# Patient Record
Sex: Male | Born: 2000 | Race: White | Hispanic: No | Marital: Single | Smoking: Current some day smoker
Health system: Southern US, Community
[De-identification: ages and names within clinical notes are randomized; demographics above are authoritative.]

## PROBLEM LIST (undated history)

## (undated) DIAGNOSIS — J302 Other seasonal allergic rhinitis: Secondary | ICD-10-CM

## (undated) DIAGNOSIS — J45909 Unspecified asthma, uncomplicated: Secondary | ICD-10-CM

## (undated) HISTORY — PX: FRACTURE SURGERY: SHX138

---
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
## 2018-05-01 ENCOUNTER — Other Ambulatory Visit: Payer: Self-pay

## 2018-05-01 ENCOUNTER — Encounter: Payer: Self-pay | Admitting: Emergency Medicine

## 2018-05-01 ENCOUNTER — Ambulatory Visit
Admission: EM | Admit: 2018-05-01 | Discharge: 2018-05-01 | Disposition: A | Discharge status: Home / Self Care | Payer: BLUE CROSS/BLUE SHIELD | Attending: Family Medicine | Admitting: Family Medicine

## 2018-05-01 DIAGNOSIS — J0110 Acute frontal sinusitis, unspecified: Secondary | ICD-10-CM

## 2018-05-01 DIAGNOSIS — J069 Acute upper respiratory infection, unspecified: Secondary | ICD-10-CM | POA: Diagnosis not present

## 2018-05-01 DIAGNOSIS — J0100 Acute maxillary sinusitis, unspecified: Secondary | ICD-10-CM

## 2018-05-01 MED ORDER — DOXYCYCLINE HYCLATE 100 MG PO CAPS: 100.0000 mg | ORAL_CAPSULE | Freq: Two times a day (BID) | ORAL | 0 refills | Status: DC

## 2018-05-01 NOTE — ED Provider Notes (Signed)
[facility] ____________________________________________  Time seen: Approximately 2:10 PM  I have reviewed the triage vital signs and the nursing notes.   HISTORY  Chief Complaint Facial Pain and Cough   HPI Ryan Mcintosh is a 17 y.o. male presenting for evaluation of runny nose, nasal congestion, cough and chest congestion sensation present for the last 3 weeks.  States initially felt like it was a cold but has not yet gone away.  States he has been having continued sinus pressure in his forehead and his cheeks described as mild to moderate.  States getting a lot of thick greenish-yellowish nasal drainage out.  States cough is sometimes productive but also sometimes dry and hacky.  States cough is not been bothering him as much.  States more sinus pressure is bothering him more.  Unresolved with multiple over-the-counter cough and cold combination agents.  Denies fevers.  Continues to overall eat and drink well.  Some intermittent sore throat, mild sore throat currently.  Does intermittently have some pain in his chest when coughing or deep breathing, denies any chest pain at all currently.  No shortness of breath.  Denies hemoptysis.  Denies long travel or immobilization.  No lower extremity swelling.  Former smoker, not currently.  No vaping.  Denies other aggravating or alleviating factors.  Reports otherwise feels well. Denies recent sickness.    Past Medical History:  Diagnosis Date  . Asthma   . Seasonal allergies     There are no active problems to display for this patient.   History reviewed. No pertinent surgical history.   No current facility-administered medications for this encounter.   Current Outpatient Medications:  .  doxycycline (VIBRAMYCIN) 100 MG capsule, Take 1 capsule (100 mg total) by mouth 2 (two) times daily., Disp: 20 capsule, Rfl: 0  Allergies Patient has no known allergies.  Family History  Problem Relation Age of Onset  . Healthy  Mother   . Healthy Father     Social History Social History   Tobacco Use  . Smoking status: Never Smoker  . Smokeless tobacco: Former Engineer, water Use Topics  . Alcohol use: Yes  . Drug use: Yes    Types: Marijuana    Review of Systems Constitutional: No fever/chills ENT: No sore throat. Cardiovascular: As above.  Respiratory: Denies shortness of breath. Gastrointestinal: No abdominal pain.   Musculoskeletal: Negative for back pain. Skin: Negative for rash.  ____________________________________________   PHYSICAL EXAM:  VITAL SIGNS: ED Triage Vitals  Enc Vitals Group     BP 05/01/18 1352 133/79     Pulse Rate 05/01/18 1352 (!) 55     Resp 05/01/18 1352 18     Temp 05/01/18 1352 98.2 F (36.8 C)     Temp Source 05/01/18 1352 Oral     SpO2 05/01/18 1352 100 %     Weight 05/01/18 1353 140 lb (63.5 kg)     Height 05/01/18 1353 5\' 8"  (1.727 m)     Head Circumference --      Peak Flow --      Pain Score 05/01/18 1353 6     Pain Loc --      Pain Edu? --      Excl. in GC? --    Constitutional: Alert and oriented. Well appearing and in no acute distress. Eyes: Conjunctivae are normal.  Head: Atraumatic.Mild to moderate tenderness to palpation bilateral frontal and maxillary sinuses. No swelling. No erythema.   Ears: no erythema, normal TMs  bilaterally.   Nose: nasal congestion with bilateral nasal turbinate erythema and edema.   Mouth/Throat: Mucous membranes are moist. Oropharynx non-erythematous.No tonsillar swelling or exudate.  Neck: No stridor.  No cervical spine tenderness to palpation. Hematological/Lymphatic/Immunilogical: No cervical lymphadenopathy. Cardiovascular: Normal rate, regular rhythm. Grossly normal heart sounds.  Good peripheral circulation. Respiratory: Normal respiratory effort. No retractions. No wheezes or rales. Mild scattered rhonchi. Good air movement. Speaks in complete sentences. Musculoskeletal: Steady gait.  No lower extremity edema  noted bilaterally. Neurologic:  Normal speech and language. No gait instability. Skin:  Skin is warm, dry and intact. No rash noted.  Psychiatric: Mood and affect are normal. Speech and behavior are normal.   ___________________________________________   LABS (all labs ordered are listed, but only abnormal results are displayed)  Labs Reviewed - No data to display ____________________________________________ PROCEDURES Procedures    INITIAL IMPRESSION / ASSESSMENT AND PLAN / ED COURSE  Pertinent labs & imaging results that were available during my care of the patient were reviewed by me and considered in my medical decision making (see chart for details).  Well-appearing patient.  No acute distress.  Suspect recent viral upper respiratory infection, suspect secondary frontal and maxillary sinusitis.  Will treat with oral doxycycline, continue over-the-counter cough and congestion medication, encourage supportive care.  Rest and fluids.  Discussed very strict follow-up and return parameters.  Patient agreed with this plan. Discussed indication, risks and benefits of medications with patient, including photosensitivity.  Discussed follow up with Primary care physician this week as needed. Discussed follow up and return parameters including no resolution or any worsening concerns. Patient verbalized understanding and agreed to plan.   ____________________________________________   FINAL CLINICAL IMPRESSION(S) / ED DIAGNOSES  Final diagnoses:  Acute frontal sinusitis, recurrence not specified  Acute maxillary sinusitis, recurrence not specified  Upper respiratory tract infection, unspecified type     ED Discharge Orders         Ordered    doxycycline (VIBRAMYCIN) 100 MG capsule  2 times daily     05/01/18 1411           Note: This dictation was prepared with Dragon dictation along with smaller phrase technology. Any transcriptional errors that result from this process are  unintentional.         Renford Dills, NP 05/01/18 1441

## 2018-05-01 NOTE — ED Triage Notes (Signed)
Patient c/o sinus pain/pressure, chest congestion x 3 weeks. Patient has tried OTC cold and sinus flu medicine with no relief.

## 2018-05-01 NOTE — Discharge Instructions (Addendum)
Take medication as prescribed. Rest. Drink plenty of fluids. Over the counter medication as discussed.  ° °Follow up with your primary care physician this week as needed. Return to Urgent care for new or worsening concerns.  ° °

## 2018-05-07 ENCOUNTER — Ambulatory Visit
Admission: EM | Admit: 2018-05-07 | Discharge: 2018-05-07 | Disposition: A | Discharge status: Home / Self Care | Payer: BLUE CROSS/BLUE SHIELD | Attending: Family Medicine | Admitting: Family Medicine

## 2018-05-07 ENCOUNTER — Encounter: Payer: Self-pay | Admitting: Emergency Medicine

## 2018-05-07 ENCOUNTER — Other Ambulatory Visit: Payer: Self-pay

## 2018-05-07 DIAGNOSIS — K219 Gastro-esophageal reflux disease without esophagitis: Secondary | ICD-10-CM | POA: Diagnosis not present

## 2018-05-07 DIAGNOSIS — J988 Other specified respiratory disorders: Secondary | ICD-10-CM

## 2018-05-07 MED ORDER — ALBUTEROL SULFATE HFA 108 (90 BASE) MCG/ACT IN AERS: 1.0000 | INHALATION_SPRAY | Freq: Four times a day (QID) | RESPIRATORY_TRACT | 0 refills | Status: DC | PRN

## 2018-05-07 MED ORDER — PANTOPRAZOLE SODIUM 40 MG PO TBEC: 40.0000 mg | DELAYED_RELEASE_TABLET | Freq: Every day | ORAL | 2 refills | Status: AC

## 2018-05-07 MED ORDER — PREDNISONE 50 MG PO TABS: ORAL_TABLET | ORAL | 0 refills | Status: DC

## 2018-05-07 MED ORDER — AMOXICILLIN-POT CLAVULANATE 875-125 MG PO TABS: 1.0000 | ORAL_TABLET | Freq: Two times a day (BID) | ORAL | 0 refills | Status: DC

## 2018-05-07 NOTE — ED Provider Notes (Signed)
[facility]    CSN: [id number] Arrival date & time: 05/07/18  1816  History   Chief Complaint Chief Complaint  Patient presents with  . Sinus Problem  . Cough    HPI  17 year old male presents with persistent respiratory symptoms.  Patient was recently seen last week and was diagnosed with sinusitis.  He states that he has continued to have symptoms despite the antibody.  He reports chest congestion as well as sinus congestion.  He also reports shortness of breath and nonproductive cough.  He has had no improvement in his symptoms.  Has a history of asthma.  Feels short of breath quite often.  Additionally, patient reports associated abdominal bloating and burning/heartburn.  He states it is worse after he eats because he often lies down quickly after eating.  He is taking Zantac without resolution.  No fever.  No other reported symptoms.  No other complaints.  Hx reviewed as below. Past Medical History:  Diagnosis Date  . Asthma   . Seasonal allergies    History reviewed. No pertinent surgical history.  Home Medications    Prior to Admission medications   Medication Sig Start Date End Date Taking? Authorizing Provider  albuterol (PROVENTIL HFA;VENTOLIN HFA) 108 (90 Base) MCG/ACT inhaler Inhale 1-2 puffs into the lungs every 6 (six) hours as needed for wheezing or shortness of breath. 05/07/18   Tommie Sams, DO  amoxicillin-clavulanate (AUGMENTIN) 875-125 MG tablet Take 1 tablet by mouth every 12 (twelve) hours. 05/07/18   Tommie Sams, DO  pantoprazole (PROTONIX) 40 MG tablet Take 1 tablet (40 mg total) by mouth daily. 05/07/18   Tommie Sams, DO  predniSONE (DELTASONE) 50 MG tablet 1 tablet daily x 5 days 05/07/18   Tommie Sams, DO   Family History Family History  Problem Relation Age of Onset  . Healthy Mother   . Healthy Father    Social History Social History   Tobacco Use  . Smoking status: Never Smoker  . Smokeless tobacco: Former Research scientist (medical) Use Topics  . Alcohol use: Yes  . Drug use: Yes    Types: Marijuana   Allergies   Patient has no known allergies.  Review of Systems Review of Systems  Constitutional: Negative for fever.  HENT: Positive for congestion.   Respiratory: Positive for cough and shortness of breath.   Gastrointestinal:       GERD.   Physical Exam Triage Vital Signs ED Triage Vitals  Enc Vitals Group     BP 05/07/18 1828 135/83     Pulse Rate 05/07/18 1828 69     Resp 05/07/18 1828 16     Temp 05/07/18 1828 97.6 F (36.4 C)     Temp Source 05/07/18 1828 Oral     SpO2 05/07/18 1828 100 %     Weight 05/07/18 1825 140 lb (63.5 kg)     Height 05/07/18 1825 5\' 8"  (1.727 m)     Head Circumference --      Peak Flow --      Pain Score 05/07/18 1825 6     Pain Loc --      Pain Edu? --      Excl. in GC? --    Updated Vital Signs BP 135/83 (BP Location: Left Arm)   Pulse 69   Temp 97.6 F (36.4 C) (Oral)   Resp 16   Ht 5\' 8"  (1.727 m)   Wt 63.5 kg   SpO2 100%  BMI 21.29 kg/m   Physical Exam  Constitutional: He is oriented to person, place, and time. He appears well-developed. No distress.  HENT:  Head: Normocephalic and atraumatic.  Nose: Nose normal.  Mouth/Throat: Oropharynx is clear and moist.  Eyes: Conjunctivae are normal. Right eye exhibits no discharge. Left eye exhibits no discharge.  Cardiovascular: Normal rate and regular rhythm.  Pulmonary/Chest: Effort normal and breath sounds normal. He has no wheezes. He has no rales.  Neurological: He is alert and oriented to person, place, and time.  Psychiatric:  Anxious.   Nursing note and vitals reviewed.  UC Treatments / Results  Labs (all labs ordered are listed, but only abnormal results are displayed) Labs Reviewed - No data to display  EKG None  Radiology No results found.  Procedures Procedures (including critical care time)  Medications Ordered in UC Medications - No data to display  Initial  Impression / Assessment and Plan / UC Course  I have reviewed the triage vital signs and the nursing notes.  Pertinent labs & imaging results that were available during my care of the patient were reviewed by me and considered in my medical decision making (see chart for details).    17 year old male presents with persistent respiratory symptoms.  He has not improved since he was placed on antibiotic therapy.  Stopping doxycycline.  Started Augmentin.  Placing on prednisone as well.  Albuterol as needed.  Additionally, patient experiencing symptoms of GERD.  Starting on Protonix.  Final Clinical Impressions(s) / UC Diagnoses   Final diagnoses:  Respiratory infection  Gastroesophageal reflux disease without esophagitis     Discharge Instructions     Medications as prescribed.  Hope you feel better  Take care  Dr. Adriana Simas     ED Prescriptions    Medication Sig Dispense Auth. Provider   amoxicillin-clavulanate (AUGMENTIN) 875-125 MG tablet Take 1 tablet by mouth every 12 (twelve) hours. 14 tablet Darrie Macmillan G, DO   predniSONE (DELTASONE) 50 MG tablet 1 tablet daily x 5 days 5 tablet Leiyah Maultsby G, DO   pantoprazole (PROTONIX) 40 MG tablet Take 1 tablet (40 mg total) by mouth daily. 30 tablet Melena Hayes G, DO   albuterol (PROVENTIL HFA;VENTOLIN HFA) 108 (90 Base) MCG/ACT inhaler Inhale 1-2 puffs into the lungs every 6 (six) hours as needed for wheezing or shortness of breath. 1 Inhaler Tommie Sams, DO     Controlled Substance Prescriptions [place] Controlled Substance Registry consulted? Not Applicable   Tommie Sams, DO 05/07/18 1902

## 2018-05-07 NOTE — ED Triage Notes (Signed)
Patient c/o ongoing cough, chest congestion and sinus congestion and pressure for over a week.  Patient was seen a week ago and has been on an antibiotic.  Patient has one more dose of his antibiotic left.  Patient not sure if he has GERD also.

## 2018-05-07 NOTE — Discharge Instructions (Signed)
Medications as prescribed.  Hope you feel better.  Take care  Dr. Tess Potts  

## 2018-05-18 ENCOUNTER — Other Ambulatory Visit: Payer: Self-pay

## 2018-05-18 ENCOUNTER — Emergency Department
Admission: EM | Admit: 2018-05-18 | Discharge: 2018-05-18 | Disposition: A | Discharge status: Home / Self Care | Payer: BLUE CROSS/BLUE SHIELD | Attending: Emergency Medicine | Admitting: Emergency Medicine

## 2018-05-18 ENCOUNTER — Emergency Department: Payer: BLUE CROSS/BLUE SHIELD

## 2018-05-18 DIAGNOSIS — N50819 Testicular pain, unspecified: Secondary | ICD-10-CM | POA: Insufficient documentation

## 2018-05-18 DIAGNOSIS — Z5321 Procedure and treatment not carried out due to patient leaving prior to being seen by health care provider: Secondary | ICD-10-CM | POA: Insufficient documentation

## 2018-05-18 LAB — URINALYSIS, COMPLETE (UACMP) WITH MICROSCOPIC
BILIRUBIN URINE: NEGATIVE
Bacteria, UA: NONE SEEN
Glucose, UA: NEGATIVE mg/dL
HGB URINE DIPSTICK: NEGATIVE
Ketones, ur: NEGATIVE mg/dL
Leukocytes, UA: NEGATIVE
Nitrite: NEGATIVE
PH: 8 (ref 5.0–8.0)
Protein, ur: NEGATIVE mg/dL
SPECIFIC GRAVITY, URINE: 1.005 (ref 1.005–1.030)
Squamous Epithelial / LPF: NONE SEEN (ref 0–5)
WBC, UA: NONE SEEN WBC/hpf (ref 0–5)

## 2018-05-18 NOTE — ED Notes (Signed)
Called for room. No reponse.

## 2018-05-18 NOTE — ED Notes (Signed)
Called for room. No response 

## 2018-05-18 NOTE — ED Triage Notes (Signed)
Sent by urgent care for possible testicular torsion. Pain since yesterday. Denies injury.

## 2018-05-18 NOTE — ED Notes (Signed)
Patient not answering when called.  This nurse called patient on mobile phone.  He stated he left due to long wait.  He stated he would return if he felt worse.

## 2018-06-10 ENCOUNTER — Other Ambulatory Visit: Payer: Self-pay

## 2018-06-10 ENCOUNTER — Ambulatory Visit
Admission: EM | Admit: 2018-06-10 | Discharge: 2018-06-10 | Disposition: A | Discharge status: Home / Self Care | Payer: BLUE CROSS/BLUE SHIELD | Attending: Family Medicine | Admitting: Family Medicine

## 2018-06-10 ENCOUNTER — Encounter: Payer: Self-pay | Admitting: Emergency Medicine

## 2018-06-10 DIAGNOSIS — J111 Influenza due to unidentified influenza virus with other respiratory manifestations: Secondary | ICD-10-CM | POA: Diagnosis not present

## 2018-06-10 DIAGNOSIS — R059 Cough, unspecified: Secondary | ICD-10-CM

## 2018-06-10 DIAGNOSIS — J029 Acute pharyngitis, unspecified: Secondary | ICD-10-CM | POA: Diagnosis not present

## 2018-06-10 DIAGNOSIS — R05 Cough: Secondary | ICD-10-CM | POA: Diagnosis not present

## 2018-06-10 DIAGNOSIS — J3089 Other allergic rhinitis: Secondary | ICD-10-CM | POA: Diagnosis not present

## 2018-06-10 LAB — RAPID INFLUENZA A&B ANTIGENS (ARMC ONLY)
INFLUENZA A (ARMC): NEGATIVE
INFLUENZA B (ARMC): NEGATIVE

## 2018-06-10 LAB — RAPID STREP SCREEN (MED CTR MEBANE ONLY): STREPTOCOCCUS, GROUP A SCREEN (DIRECT): NEGATIVE

## 2018-06-10 MED ORDER — ALBUTEROL SULFATE HFA 108 (90 BASE) MCG/ACT IN AERS: 2.0000 | INHALATION_SPRAY | Freq: Four times a day (QID) | RESPIRATORY_TRACT | 1 refills | Status: DC | PRN

## 2018-06-10 MED ORDER — OSELTAMIVIR PHOSPHATE 75 MG PO CAPS: 75.0000 mg | ORAL_CAPSULE | Freq: Two times a day (BID) | ORAL | 0 refills | Status: AC

## 2018-06-10 MED ORDER — MONTELUKAST SODIUM 10 MG PO TABS: 10.0000 mg | ORAL_TABLET | Freq: Every day | ORAL | 2 refills | Status: DC

## 2018-06-10 MED ORDER — CETIRIZINE HCL 10 MG PO TABS: 10.0000 mg | ORAL_TABLET | Freq: Every day | ORAL | 2 refills | Status: DC

## 2018-06-10 NOTE — ED Triage Notes (Signed)
Pt c/o sore throat, fever (102 last night), post nasal drainage, swollen tonsils, dry cough, shortness of breath, body aches and chills. Started about 2 days ago. He was treated for a URI back in October and has had post nasal drainage since then.

## 2018-06-10 NOTE — ED Provider Notes (Signed)
[facility]    CSN: [id number] Arrival date & time: 06/10/18  0945     History   Chief Complaint Chief Complaint  Patient presents with  . Sore Throat    HPI Ryan Mcintosh is a 17 y.o. male.   17 year old male presents with sore throat, nasal congestion, cough, body aches and chills that started 2 days ago. Yesterday developed a fever of 102. Denies any GI symptoms. No distinct family or friends who are ill. Has taken Mucus relief OTC medication and Ibuprofen with minimal relief. Has history of asthma and allergic rhinitis. Ran out of Albuterol recently and was on Zyrtec and Singulair in the past with good success. Has had persistent nasal drainage since Urgent Care visit in October. Was on Doxycycline and then switched to Augmentin and Prednisone for sinus infection/drainage with limited success. Also takes Protonix daily prn for reflux. Did not get a flu vaccine this year.   The history is provided by the patient.    Past Medical History:  Diagnosis Date  . Asthma   . Seasonal allergies     There are no active problems to display for this patient.   Past Surgical History:  Procedure Laterality Date  . FRACTURE SURGERY         Home Medications    Prior to Admission medications   Medication Sig Start Date End Date Taking? Authorizing Provider  pantoprazole (PROTONIX) 40 MG tablet Take 1 tablet (40 mg total) by mouth daily. 05/07/18  Yes Mcintosh, Ryan G, DO  albuterol (PROVENTIL HFA;VENTOLIN HFA) 108 (90 Base) MCG/ACT inhaler Inhale 2 puffs into the lungs every 6 (six) hours as needed for wheezing or shortness of breath. 06/10/18   Sudie Grumbling, NP  cetirizine (ZYRTEC) 10 MG tablet Take 1 tablet (10 mg total) by mouth daily. 06/10/18   Sudie Grumbling, NP  montelukast (SINGULAIR) 10 MG tablet Take 1 tablet (10 mg total) by mouth at bedtime. 06/10/18   Sudie Grumbling, NP  oseltamivir (TAMIFLU) 75 MG capsule Take 1 capsule (75 mg total) by mouth  every 12 (twelve) hours for 5 days. 06/10/18 06/15/18  Sudie Grumbling, NP    Family History Family History  Problem Relation Age of Onset  . Healthy Mother   . Healthy Father     Social History Social History   Tobacco Use  . Smoking status: Never Smoker  . Smokeless tobacco: Current User    Types: Snuff  Substance Use Topics  . Alcohol use: Not Currently  . Drug use: Not Currently    Types: Marijuana     Allergies   Patient has no known allergies.   Review of Systems Review of Systems  Constitutional: Positive for chills, fatigue and fever. Negative for activity change and appetite change.  HENT: Positive for congestion, postnasal drip, rhinorrhea, sinus pressure and sore throat. Negative for ear discharge, ear pain, facial swelling, mouth sores, nosebleeds, sinus pain, sneezing and trouble swallowing.   Eyes: Negative for pain, discharge, redness and itching.  Respiratory: Positive for cough. Negative for chest tightness, shortness of breath and wheezing.   Gastrointestinal: Negative for abdominal pain, diarrhea, nausea and vomiting.  Musculoskeletal: Positive for arthralgias and myalgias. Negative for neck pain and neck stiffness.  Skin: Negative for color change, rash and wound.  Allergic/Immunologic: Positive for environmental allergies.  Neurological: Positive for headaches. Negative for dizziness, tremors, seizures, syncope, speech difficulty, weakness, light-headedness and numbness.  Hematological: Negative for adenopathy. Does not  bruise/bleed easily.     Physical Exam Triage Vital Signs ED Triage Vitals  Enc Vitals Group     BP 06/10/18 1000 135/84     Pulse Rate 06/10/18 1000 80     Resp 06/10/18 1000 17     Temp 06/10/18 1000 98.3 F (36.8 C)     Temp Source 06/10/18 1000 Oral     SpO2 06/10/18 1000 99 %     Weight 06/10/18 0955 143 lb (64.9 kg)     Height 06/10/18 0955 5\' 7"  (1.702 m)     Head Circumference --      Peak Flow --      Pain Score  06/10/18 0955 7     Pain Loc --      Pain Edu? --      Excl. in GC? --    No data found.  Updated Vital Signs BP 135/84 (BP Location: Left Arm)   Pulse 80   Temp 98.3 F (36.8 C) (Oral)   Resp 17   Ht 5\' 7"  (1.702 m)   Wt 143 lb (64.9 kg)   SpO2 99%   BMI 22.40 kg/m   Visual Acuity Right Eye Distance:   Left Eye Distance:   Bilateral Distance:    Right Eye Near:   Left Eye Near:    Bilateral Near:     Physical Exam  Constitutional: He is oriented to person, place, and time. Vital signs are normal. He appears well-developed and well-nourished. He is cooperative. He appears ill. No distress.  He is sitting comfortably on exam table in no acute distress but appears ill.   HENT:  Head: Normocephalic and atraumatic.  Right Ear: Hearing, tympanic membrane, external ear and ear canal normal.  Left Ear: Hearing, tympanic membrane, external ear and ear canal normal.  Nose: Mucosal edema and rhinorrhea present. Right sinus exhibits maxillary sinus tenderness. Right sinus exhibits no frontal sinus tenderness. Left sinus exhibits maxillary sinus tenderness. Left sinus exhibits no frontal sinus tenderness.  Mouth/Throat: Uvula is midline and mucous membranes are normal. Posterior oropharyngeal erythema present.  Eyes: EOM are normal. Right conjunctiva is injected (mildly). Left conjunctiva is injected (mildly).  Neck: Normal range of motion. Neck supple.  Cardiovascular: Normal rate, regular rhythm and normal heart sounds.  No murmur heard. Pulmonary/Chest: Effort normal and breath sounds normal. No respiratory distress. He has no decreased breath sounds. He has no wheezes. He has no rhonchi. He has no rales.  Musculoskeletal: Normal range of motion.  Lymphadenopathy:    He has no cervical adenopathy.  Neurological: He is alert and oriented to person, place, and time.  Skin: Skin is warm and dry. Capillary refill takes less than 2 seconds. No rash noted.  Psychiatric: He has a  normal mood and affect. His behavior is normal. Judgment and thought content normal.  Vitals reviewed.    UC Treatments / Results  Labs (all labs ordered are listed, but only abnormal results are displayed) Labs Reviewed  RAPID INFLUENZA A&B ANTIGENS ([facility] ONLY)  RAPID STREP SCREEN ([facility] ONLY)  CULTURE, GROUP A STREP [facility])    EKG None  Radiology No results found.  Procedures Procedures (including critical care time)  Medications Ordered in UC Medications - No data to display  Initial Impression / Assessment and Plan / UC Course  I have reviewed the triage vital signs and the nursing notes.  Pertinent labs & imaging results that were available during my care of the patient were reviewed  by me and considered in my medical decision making (see chart for details).    Reviewed negative rapid strep test with patient. Also discussed rapid influenza testing and limited accuracy. Discussed that he probably has influenza based on history and clinical findings. Patient desires anti-viral medications. Recommend start Tamiflu 75mg  twice a day as directed. May take OTC Sudafed and Mucinex as directed for congestion. Recommend restart allergy medications- Zyrtec 10mg  daily, Singulair 10mg  daily and use Albuterol inhaler 2 puffs every 6 hours as needed for cough or wheezing. May use Ibuprofen as directed for fever. May need ENT consult if allergic rhinitis symptoms do not improve. Follow-up with [facility] in 5 to 7 days if needed.  Final Clinical Impressions(s) / UC Diagnoses   Final diagnoses:  Influenza  Sore throat  Cough  Non-seasonal allergic rhinitis due to other allergic trigger     Discharge Instructions     Recommend start Tamiflu 75mg  twice a day as directed. May use Singulair 10mg  once daily. Continue Albuterol inhaler 2 puffs every 6 hours as needed for wheezing and cough. May restart Zyrtec 10mg  once daily. Encouraged to continue OTC Sudafed and Mucinex to help  with congestion. May continue Ibuprofen as needed for fever. Follow-up with [facility] if nasal drainage and congestion do not start to improve within 5 to 7 days.     ED Prescriptions    Medication Sig Dispense Auth. Provider   albuterol (PROVENTIL HFA;VENTOLIN HFA) 108 (90 Base) MCG/ACT inhaler Inhale 2 puffs into the lungs every 6 (six) hours as needed for wheezing or shortness of breath. 1 Inhaler Amyot, Ali Lowe, NP   oseltamivir (TAMIFLU) 75 MG capsule Take 1 capsule (75 mg total) by mouth every 12 (twelve) hours for 5 days. 10 capsule Sudie Grumbling, NP   montelukast (SINGULAIR) 10 MG tablet Take 1 tablet (10 mg total) by mouth at bedtime. 30 tablet Sudie Grumbling, NP   cetirizine (ZYRTEC) 10 MG tablet Take 1 tablet (10 mg total) by mouth daily. 30 tablet Sudie Grumbling, NP     Controlled Substance Prescriptions [place] Controlled Substance Registry consulted? Not Applicable   Sudie Grumbling, NP 06/11/18 [id number]

## 2018-06-10 NOTE — Discharge Instructions (Addendum)
Recommend start Tamiflu 75mg  twice a day as directed. May use Singulair 10mg  once daily. Continue Albuterol inhaler 2 puffs every 6 hours as needed for wheezing and cough. May restart Zyrtec 10mg  once daily. Encouraged to continue OTC Sudafed and Mucinex to help with congestion. May continue Ibuprofen as needed for fever. Follow-up with [facility] if nasal drainage and congestion do not start to improve within 5 to 7 days.

## 2018-06-13 LAB — CULTURE, GROUP A STREP (THRC)

## 2018-10-27 DIAGNOSIS — J4540 Moderate persistent asthma, uncomplicated: Secondary | ICD-10-CM | POA: Insufficient documentation

## 2018-11-15 ENCOUNTER — Emergency Department: Payer: BLUE CROSS/BLUE SHIELD

## 2018-11-15 ENCOUNTER — Emergency Department
Admission: EM | Admit: 2018-11-15 | Discharge: 2018-11-15 | Disposition: A | Discharge status: Home / Self Care | Payer: BLUE CROSS/BLUE SHIELD | Attending: Emergency Medicine | Admitting: Emergency Medicine

## 2018-11-15 ENCOUNTER — Ambulatory Visit (INDEPENDENT_AMBULATORY_CARE_PROVIDER_SITE_OTHER)
Admission: EM | Admit: 2018-11-15 | Discharge: 2018-11-15 | Disposition: A | Discharge status: Home / Self Care | Payer: BLUE CROSS/BLUE SHIELD | Source: Home / Self Care | Attending: Family Medicine | Admitting: Family Medicine

## 2018-11-15 ENCOUNTER — Encounter: Payer: Self-pay | Admitting: Emergency Medicine

## 2018-11-15 ENCOUNTER — Other Ambulatory Visit: Payer: Self-pay

## 2018-11-15 DIAGNOSIS — K2920 Alcoholic gastritis without bleeding: Secondary | ICD-10-CM | POA: Insufficient documentation

## 2018-11-15 DIAGNOSIS — J45909 Unspecified asthma, uncomplicated: Secondary | ICD-10-CM | POA: Diagnosis not present

## 2018-11-15 DIAGNOSIS — F411 Generalized anxiety disorder: Secondary | ICD-10-CM | POA: Insufficient documentation

## 2018-11-15 DIAGNOSIS — R110 Nausea: Secondary | ICD-10-CM

## 2018-11-15 DIAGNOSIS — R42 Dizziness and giddiness: Secondary | ICD-10-CM | POA: Diagnosis not present

## 2018-11-15 DIAGNOSIS — R079 Chest pain, unspecified: Secondary | ICD-10-CM | POA: Insufficient documentation

## 2018-11-15 DIAGNOSIS — R0789 Other chest pain: Secondary | ICD-10-CM

## 2018-11-15 DIAGNOSIS — F10129 Alcohol abuse with intoxication, unspecified: Secondary | ICD-10-CM | POA: Diagnosis not present

## 2018-11-15 DIAGNOSIS — F17210 Nicotine dependence, cigarettes, uncomplicated: Secondary | ICD-10-CM | POA: Insufficient documentation

## 2018-11-15 LAB — COMPREHENSIVE METABOLIC PANEL
ALT: 19 U/L (ref 0–44)
AST: 28 U/L (ref 15–41)
Albumin: 4.6 g/dL (ref 3.5–5.0)
Alkaline Phosphatase: 70 U/L (ref 38–126)
Anion gap: 12 (ref 5–15)
BUN: 9 mg/dL (ref 6–20)
CO2: 22 mmol/L (ref 22–32)
Calcium: 10 mg/dL (ref 8.9–10.3)
Chloride: 98 mmol/L (ref 98–111)
Creatinine, Ser: 0.91 mg/dL (ref 0.61–1.24)
GFR calc Af Amer: >60 mL/min (ref >60)
GFR calc non Af Amer: >60 mL/min (ref >60)
Glucose, Bld: 84 mg/dL (ref 70–99)
Potassium: 4.3 mmol/L (ref 3.5–5.1)
Sodium: 132 mmol/L — ABNORMAL LOW (ref 135–145)
Total Bilirubin: 1.3 mg/dL — ABNORMAL HIGH (ref 0.3–1.2)
Total Protein: 8.4 g/dL — ABNORMAL HIGH (ref 6.5–8.1)

## 2018-11-15 LAB — TROPONIN I
Troponin I: <0.03 ng/mL (ref <0.03)
Troponin I: <0.03 ng/mL (ref <0.03)

## 2018-11-15 LAB — CBC
HCT: 48.8 % (ref 39.0–52.0)
Hemoglobin: 16.5 g/dL (ref 13.0–17.0)
MCH: 31 pg (ref 26.0–34.0)
MCHC: 33.8 g/dL (ref 30.0–36.0)
MCV: 91.6 fL (ref 80.0–100.0)
Platelets: 348 10*3/uL (ref 150–400)
RBC: 5.33 MIL/uL (ref 4.22–5.81)
RDW: 13.8 % (ref 11.5–15.5)
WBC: 10.2 10*3/uL (ref 4.0–10.5)
nRBC: 0 % (ref 0.0–0.2)

## 2018-11-15 LAB — MAGNESIUM: Magnesium: 2.3 mg/dL (ref 1.7–2.4)

## 2018-11-15 LAB — ETHANOL: Alcohol, Ethyl (B): <10 mg/dL (ref <10)

## 2018-11-15 MED ORDER — ONDANSETRON HCL 4 MG/2ML IJ SOLN
4.0000 mg | Freq: Once | INTRAMUSCULAR | Status: AC
  Administered 2018-11-15: 4 mg via INTRAVENOUS
  Filled 2018-11-15: qty 2

## 2018-11-15 MED ORDER — FAMOTIDINE 20 MG PO TABS
20.0000 mg | ORAL_TABLET | Freq: Once | ORAL | Status: AC
  Administered 2018-11-15: 20 mg via ORAL
  Filled 2018-11-15: qty 1

## 2018-11-15 MED ORDER — SODIUM CHLORIDE 0.9 % IV BOLUS
1000.0000 mL | Freq: Once | INTRAVENOUS | Status: AC
  Administered 2018-11-15: 1000 mL via INTRAVENOUS

## 2018-11-15 MED ORDER — ALUM & MAG HYDROXIDE-SIMETH 200-200-20 MG/5ML PO SUSP
15.0000 mL | Freq: Once | ORAL | Status: AC
  Administered 2018-11-15: 15 mL via ORAL
  Filled 2018-11-15: qty 30

## 2018-11-15 MED ORDER — SODIUM CHLORIDE 0.9 % IV BOLUS
1000.0000 mL | Freq: Once | INTRAVENOUS | Status: AC
  Administered 2018-11-15: 12:00:00 1000 mL via INTRAVENOUS

## 2018-11-15 NOTE — Discharge Instructions (Signed)
GO TO ED NOW FOR FURTHER EVALUATION.
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
## 2018-11-15 NOTE — ED Triage Notes (Signed)
Patient c/o left sided chest pain that started this morning.  Patient states that he drank a lot of alcohol last night.

## 2018-11-15 NOTE — ED Provider Notes (Signed)
[facility] Emergency Department Provider Note   ____________________________________________   First MD Initiated Contact with Patient 11/15/18 1236     (approximate)  I have reviewed the triage vital signs and the nursing notes.   HISTORY  Chief Complaint Shortness of Breath    HPI Ryan Mcintosh is a 18 y.o. male reports no major medical history except for asthma  Patient reports that last night he was in his normal state of health he was out with friends and he consumed about 16 cans of beer and 8 shots of alcohol.  He normally does not drink, but he reports they are having a good time.  Reports he woke up this morning feeling extremely dehydrated, he felt like his lips were super dry and his tongue was almost stuck to his mouth.  Feels lightheaded when he stands up when he took a shower this morning he started to feel like his heart rate was high, felt a burning sensation over the left upper abdomen and left-sided chest and he reports it feels like "acid".  He reports it was a very severe burning discomfort, he went to urgent care to get it evaluated and they thought he needed to come the ER for further evaluation.  He denies that the pain radiates.  It is not a pressure feeling it is more of a burning feeling he thinks it acid reflux it is also located the base of his throat to his throat feels scratchy.  Tells me he is pretty certain that the alcohol he drank last night as a cause of his symptoms today.  He has any fever he has not had any cough, is not short of breath.  He has no history other than asthma.  No history of blood clots, no recent surgeries, no immobilization, no leg swelling.  No family history of coronary disease.   Past Medical History:  Diagnosis Date   Asthma    Seasonal allergies     There are no active problems to display for this patient.   Past Surgical History:  Procedure Laterality Date   FRACTURE SURGERY       Prior to Admission medications   Medication Sig Start Date End Date Taking? Authorizing Provider  albuterol (PROVENTIL HFA;VENTOLIN HFA) 108 (90 Base) MCG/ACT inhaler Inhale 2 puffs into the lungs every 6 (six) hours as needed for wheezing or shortness of breath. 06/10/18   Sudie Grumbling, NP  cetirizine (ZYRTEC) 10 MG tablet Take 1 tablet (10 mg total) by mouth daily. 06/10/18   Sudie Grumbling, NP  pantoprazole (PROTONIX) 40 MG tablet Take 1 tablet (40 mg total) by mouth daily. 05/07/18   Tommie Sams, DO    Allergies Patient has no known allergies.  Family History  Problem Relation Age of Onset   Healthy Mother    Healthy Father     Social History Social History   Tobacco Use   Smoking status: Current Some Day Smoker    Types: Cigarettes   Smokeless tobacco: Current User    Types: Snuff  Substance Use Topics   Alcohol use: Yes    Alcohol/week: 20.0 standard drinks    Types: 8 Shots of liquor, 12 Cans of beer per week   Drug use: Not Currently    Types: Marijuana    Review of Systems Constitutional: No fever/chills or feeling fatigued all lightheaded especially when he is standing in the shower feels like he is "dehydrated" Eyes: No visual changes.  ENT: No sore throat except it feels scratchy and very dry. Cardiovascular: Denies chest pain he reports a burning discomfort in the left lower chest region but does not radiate.  No back pain.Marland Kitchen Respiratory: Denies shortness of breath. Gastrointestinal: No abdominal pain but some nausea.   Genitourinary: Negative for dysuria. Musculoskeletal: Negative for back pain. Skin: Negative for rash. Neurological: Mild headache which she describes as a "hangover", no areas of focal weakness or numbness.    ____________________________________________   PHYSICAL EXAM:  VITAL SIGNS: ED Triage Vitals  Enc Vitals Group     BP 11/15/18 1224 137/90     Pulse Rate 11/15/18 1224 (!) 110     Resp 11/15/18 1224 20      Temp 11/15/18 1224 98.4 F (36.9 C)     Temp Source 11/15/18 1224 Oral     SpO2 11/15/18 1224 98 %     Weight 11/15/18 1222 143 lb 4.8 oz (65 kg)     Height 11/15/18 1222 5\' 7"  (1.702 m)     Head Circumference --      Peak Flow --      Pain Score 11/15/18 1222 8     Pain Loc --      Pain Edu? --      Excl. in GC? --     Constitutional: Alert and oriented. Well appearing and in no acute distress. Eyes: Conjunctivae are slightly injected bilateral. Head: Atraumatic. Nose: No congestion/rhinnorhea. Mouth/Throat: Mucous membranes are slightly dry. Neck: No stridor.  Cardiovascular: His heart rate is 90 by palpation to exam, regular rhythm. Grossly normal heart sounds.  Good peripheral circulation. Respiratory: Normal respiratory effort.  No retractions. Lungs CTAB. Gastrointestinal: Soft and nontender. No distention. Musculoskeletal: No lower extremity tenderness nor edema. Neurologic:  Normal speech and language. No gross focal neurologic deficits are appreciated.  Skin:  Skin is warm, dry and intact. No rash noted except he does have a little bit of blanching erythema over his clavicles and also over both hands. Psychiatric: Mood and affect are normal. Speech and behavior are normal.  ____________________________________________   LABS (all labs ordered are listed, but only abnormal results are displayed)  Labs Reviewed  TROPONIN I  MAGNESIUM   ____________________________________________  EKG  ED ECG REPORT I, Sharyn Creamer, the attending physician, personally viewed and interpreted this ECG.  Date: 11/15/2018 EKG Time: 1130 (was done at urgent care, but I have reviewed) Rate: 85 Rhythm: normal sinus rhythm QRS Axis: normal Intervals: normal ST/T Wave abnormalities: normal Narrative Interpretation: no evidence of acute ischemia  EKG reviewed interval me at 1225 Heart rate 120 QRS 99 PR 130 QTc 430 Sinus tachycardia without  ischemia  ____________________________________________  RADIOLOGY  Dg Chest Portable 1 View  Result Date: 11/15/2018 CLINICAL DATA:  18 year old male with history of shortness of breath, chest pain and sweating. EXAM: PORTABLE CHEST 1 VIEW COMPARISON:  Chest x-Pollak 02/27/2015. FINDINGS: Lung volumes are normal. No consolidative airspace disease. No pleural effusions. No pneumothorax. No pulmonary nodule or mass noted. Pulmonary vasculature and the cardiomediastinal silhouette are within normal limits. IMPRESSION: No radiographic evidence of acute cardiopulmonary disease. Electronically Signed   By: Trudie Reed M.D.   On: 11/15/2018 13:38     X-Hark reviewed negative for acute, I personally viewed ____________________________________________   PROCEDURES  Procedure(s) performed: None  Procedures  Critical Care performed: No  ____________________________________________   INITIAL IMPRESSION / ASSESSMENT AND PLAN / ED COURSE  Pertinent labs & imaging results that were available during  my care of the patient were reviewed by me and considered in my medical decision making (see chart for details).   Lab work from urgent care reviewed.  Notable for just slightly low sodium which is likely secondary to hypovolemia.  Also just a very minimally elevated T bilirubin with normal LFTs.  CBC is normal and his troponin in urgent care is also normal.  Patient presents for symptoms of very atypical chest pain in the setting of heavy alcohol use last night that is described as a burning I suspect likely related to gastritis or reflux.  He has no risk factors for acute coronary syndrome he has a normal EKG and his first troponin in urgent care was normal.  I will hydrate him, since he is here it seems reasonable to check 1 more troponin, but I highly doubt ACS.  Obtain chest x-Coltrane.  No infectious symptoms.  Will provide anti-reflux treatment, also hydrate, antiemetic and plan to reassess.  He does  not appear to have any risk factors for pulmonary embolism, has no pleuritic chest pain, his heart rate is normalized, and he is well-appearing with normal oxygen saturation.  Nile DearChristopher D Wholey was evaluated in Emergency Department on 11/15/2018 for the symptoms described in the history of present illness. He was evaluated in the context of the global COVID-19 pandemic, which necessitated consideration that the patient might be at risk for infection with the SARS-CoV-2 virus that causes COVID-19. Institutional protocols and algorithms that pertain to the evaluation of patients at risk for COVID-19 are in a state of rapid change based on information released by regulatory bodies including the CDC and federal and state organizations. These policies and algorithms were followed during the patient's care in the ED.   ----------------------------------------- 2:02 PM on 11/15/2018 -----------------------------------------  Work-up reassuring.  Patient reports he is feeling a lot better after receiving IV fluids.  Also reports the burning in his chest is gone away and is been able to drink fluids now without burning or discomfort.  He appears much improved.  I suspect she is related to heavy alcohol use and reflux gastritis associated.  Discussed with the patient, reviewed diagnosis, careful return precautions.  Patient agreement with this plan and well-appearing at this time in no distress  Return precautions and treatment recommendations and follow-up discussed with the patient who is agreeable with the plan.       ____________________________________________   FINAL CLINICAL IMPRESSION(S) / ED DIAGNOSES  Final diagnoses:  Acute alcoholic gastritis without hemorrhage  Atypical chest pain        Note:  This document was prepared using Dragon voice recognition software and may include unintentional dictation errors       Sharyn CreamerQuale, Asaph Serena, MD 11/15/18 1403

## 2018-11-15 NOTE — ED Notes (Signed)
Pt got NS with urgent care.

## 2018-11-15 NOTE — ED Provider Notes (Deleted)
     Verlee Monte, NP 11/15/18 [phone]

## 2018-11-15 NOTE — ED Provider Notes (Addendum)
[address], [address] [place], [place] [address] [phone]   Name: Ryan Mcintosh DOB: August 12, 2000 MRN: [id number] CSN: [id number]  Arrival date and time:  11/15/18 1214  Chief Complaint:  Shortness of Breath Chest pain  NOTE: Prior to seeing the patient today, I have reviewed the triage nursing documentation and vital signs. Clinical staff has updated patient's PMH/PSHx, current medication list, and drug allergies/intolerances to ensure comprehensive history available to assist in medical decision making.   History:   HPI: Ryan Mcintosh is a 18 y.o. male who presents to the desk today clutching his chest stating, "I think that I am having a heart attack". He is very anxious upon his arrival to [facility]. Patient complains of acute onset of LEFT sided chest pain and associated shortness of breath that began when patient woke up this morning. The pain is described as an intense "burning". Pain does not radiate into his neck, arm, shoulder, or subscapular area. He denies chest pressure. Initial HR 130 with bounding pulses. Patient states, "it feels like my heart is going to be out of my chest". RR elevated at 30.  Patient complains of being "really hot". He woke up diaphoretic and nauseated.   Patient endorses heavy ETOH last night (15 beers and 8 shots of hard liquor). He is HYPERventilating upon arrival. He complains of numbness and tingling in his hands and feet. Patient has no PMH significant for any known cardiac issues. He does have asthma. Patient has had issues with anxiety in the past, however he has never required anxiolytic medications  Past Medical History:  Diagnosis Date   Asthma    Seasonal allergies     Past Surgical History:  Procedure Laterality Date   FRACTURE SURGERY      Family History  Problem Relation Age of Onset   Healthy Mother    Healthy Father     Social History   Socioeconomic History   Marital status: Single    Spouse name: Not on  file   Number of children: Not on file   Years of education: Not on file   Highest education level: Not on file  Occupational History   Not on file  Social Needs   Financial resource strain: Not on file   Food insecurity:    Worry: Not on file    Inability: Not on file   Transportation needs:    Medical: Not on file    Non-medical: Not on file  Tobacco Use   Smoking status: Current Some Day Smoker    Types: Cigarettes   Smokeless tobacco: Current User    Types: Snuff  Substance and Sexual Activity   Alcohol use: Yes    Alcohol/week: 20.0 standard drinks    Types: 8 Shots of liquor, 12 Cans of beer per week   Drug use: Not Currently    Types: Marijuana   Sexual activity: Not on file  Lifestyle   Physical activity:    Days per week: Not on file    Minutes per session: Not on file   Stress: Not on file  Relationships   Social connections:    Talks on phone: Not on file    Gets together: Not on file    Attends religious service: Not on file    Active member of club or organization: Not on file    Attends meetings of clubs or organizations: Not on file    Relationship status: Not on file   Intimate partner violence:  Fear of current or ex partner: Not on file    Emotionally abused: Not on file    Physically abused: Not on file    Forced sexual activity: Not on file  Other Topics Concern   Not on file  Social History Narrative   Not on file    There are no active problems to display for this patient.   Home Medications:    No outpatient medications have been marked as taking for the 11/15/18 encounter [facility] Encounter).    Allergies:   Patient has no known allergies.  Review of Systems (ROS): Review of Systems  Constitutional: Negative for chills, diaphoresis and fever.  HENT: Negative for ear pain, sore throat and trouble swallowing.   Eyes: Negative for pain and visual disturbance.  Respiratory: Positive for chest tightness and  shortness of breath. Negative for cough, wheezing and stridor.   Cardiovascular: Positive for chest pain (LEFT sided; "burning") and palpitations.  Gastrointestinal: Positive for nausea. Negative for constipation, diarrhea and vomiting.  Musculoskeletal: Negative for back pain and neck pain.  Skin: Positive for color change (facial flushing). Negative for rash.  Neurological: Positive for dizziness, tremors (? DTs from ETOH), light-headedness and numbness (hands/feet). Negative for seizures, syncope and speech difficulty.  Psychiatric/Behavioral: The patient is nervous/anxious.   All other systems reviewed and are negative.    Physical Exam:  Triage Vital Signs Pulse 130, RR 30, BP 146/92, SPO2 100%, Pain 10/10  Physical Exam  Constitutional: He is oriented to person, place, and time and well-developed, well-nourished, and in no distress.  HENT:  Head: Normocephalic and atraumatic.  Mouth/Throat: Oropharynx is clear and moist and mucous membranes are normal.  Eyes: Pupils are equal, round, and reactive to light. EOM are normal.  Neck: Normal range of motion. Neck supple. No tracheal deviation present.  Cardiovascular: Regular rhythm, normal heart sounds, intact distal pulses and normal pulses. Tachycardia present. Exam reveals no gallop and no friction rub. No murmur heard. EKG with no evidence of ST segment elevation or depression. No STEMI.   Pulmonary/Chest: Effort normal and breath sounds normal. Tachypnea noted. No respiratory distress. He has no wheezes. He has no rales. He exhibits no tenderness.  Abdominal: Soft. Bowel sounds are normal. There is no abdominal tenderness.  Musculoskeletal: Normal range of motion.        General: No tenderness, deformity or edema.  Neurological: He is alert and oriented to person, place, and time. (+) upper extremity tremors (? DTs related to heavy ETOH use).  Skin: Skin is warm and dry; not diaphoretic. Abrasion (scattered about arms and legs - "I  got scratched up when I went fishing") noted. No rash noted. No erythema.  Psychiatric: Memory, affect and judgment normal. His mood appears anxious.  Nursing note and vitals reviewed.    Urgent Care Treatments / Results:   LABS: PLEASE NOTE: all labs that were ordered this encounter are listed, however only abnormal results are displayed.  CBC  CMP (Na 132 mmol/L, Total bilirubin 1.3 mg/dL)  Troponin  ETOH  EKG: Date: 11/15/2018 EKG Time: 11:29 Rate: 83 Rhythm: SR with SA. PR short at 100 ms.  ST&T Change: None Narrative Interpretation: Normal; no previous to compare.   RADIOLOGY: No results found.  PRODEDURES: Procedures  MEDICATIONS RECEIVED THIS VISIT:  1000 cc 0.9% NS - hanging at time of EMS transfer from [facility] to [facility] ED.   Initial Impression / Assessment and Plan / Urgent Care Course:   Pertinent labs & imaging results  that were available during my care of the patient were personally reviewed by me and considered in my medical decision making (see chart for details).   Ryan Mcintosh is a 18 y.o. male who [facility] with complaints of LEFT sided chest pain and shortness of breath. Patient extremely anxious and hyperventilating. [facility] concerns that he is experiencing a acute MI. Patient endorses heavy ETOH use last night (see HPI). He denies illegal substance use or energy drinks.   Suspicion for ACS low, however not fully ruled out. EKG normal. Symptoms likely consistent with reflux/gastritis related to ETOH use, withdrawals, and anxiety. Preliminary labs (CBC, CMP, ETOH, troponin) collected and sent for analysis. PIV placed and 1L 0.9% NS given. With the initiation of treatment, patient calmed considerably. He became less anxious, which in turn, improved his HR (regular in the 80s) and his RR (18). Given the limited capacity for intervention and monitoring in the UC setting, this patient's presenting symptoms warrant  further evaluation in the ED. [facility] via EMS for further evaluation and ongoing monitoring.   Patient report called to Lajuana RippleMoyer, RN (charge nurse) in the ED. Also spoke with Fanny BienQuale, MD (ED attending) via secure messaging. Updated on presenting complaints and [facility] interventions. MD and charge RN aware that patient will be presenting for care via EMS.   Final Clinical Impressions(s) / Urgent Care Diagnoses:    Chest pain, unspecified type  Anxiety state  Alcohol abuse with intoxication  New Prescriptions:  -None  Controlled Substance Prescriptions:  [place] Controlled Substance Registry consulted? Not Applicable  NOTE: This note was prepared using Dragon dictation software along with smaller phrase technology. Despite my best ability to proofread, there is the potential that transcriptional errors may still occur from this process, and are completely unintentional.   ADDENDUM: Labs that were obtained upon presentation reviewed. CBC normal. CMP showed HYPOnatremia with a  Na of 132 mmol/L. Total bilirubin mildly elevated at 1.3 mg/dL. AST and ALT normal. Troponin negative. ETOH level normal. ED course reviewed. Repeat EKG showed ST at a rate of 119; no ST changes. CXR negative for acute cardiopulmonary process. Second troponin was collected to rule out ACS; resulted as negative. Patient treated with additional IV hydration, antiemetics, and GI cocktail; symptoms improved. Etiology of CP felt to be associated with alcoholic gastritis. Patient discharged home in stable condition with plans for outpatient follow-up with PCP.    Verlee MonteGray, Ashrith Sagan E, NP 11/16/18 [phone]

## 2018-11-15 NOTE — ED Notes (Signed)
Pt describes the pain as acid reflux pain.

## 2018-11-15 NOTE — Discharge Instructions (Signed)
Avoid alcohol. Do not drink to excess.  I suspect most of your symptoms are related to heavy alcohol use from last night.  No driving today.  Return to the ER right away if you develop severe chest pain, vomiting of blood, severe abdominal pain, fevers chills or other new concerns arise.

## 2019-01-06 DIAGNOSIS — R4589 Other symptoms and signs involving emotional state: Secondary | ICD-10-CM | POA: Insufficient documentation

## 2019-01-06 DIAGNOSIS — J301 Allergic rhinitis due to pollen: Secondary | ICD-10-CM | POA: Insufficient documentation

## 2020-10-15 ENCOUNTER — Encounter: Payer: Self-pay | Admitting: Emergency Medicine

## 2020-10-15 ENCOUNTER — Emergency Department
Admission: EM | Admit: 2020-10-15 | Discharge: 2020-10-15 | Disposition: A | Discharge status: Home / Self Care | Payer: BC Managed Care – PPO | Attending: Emergency Medicine | Admitting: Emergency Medicine

## 2020-10-15 ENCOUNTER — Other Ambulatory Visit: Payer: Self-pay

## 2020-10-15 ENCOUNTER — Emergency Department: Payer: BC Managed Care – PPO

## 2020-10-15 DIAGNOSIS — J45909 Unspecified asthma, uncomplicated: Secondary | ICD-10-CM | POA: Diagnosis not present

## 2020-10-15 DIAGNOSIS — Z79899 Other long term (current) drug therapy: Secondary | ICD-10-CM | POA: Diagnosis not present

## 2020-10-15 DIAGNOSIS — S0011XA Contusion of right eyelid and periocular area, initial encounter: Secondary | ICD-10-CM | POA: Diagnosis not present

## 2020-10-15 DIAGNOSIS — S0083XA Contusion of other part of head, initial encounter: Secondary | ICD-10-CM

## 2020-10-15 DIAGNOSIS — W19XXXA Unspecified fall, initial encounter: Secondary | ICD-10-CM

## 2020-10-15 DIAGNOSIS — S0993XA Unspecified injury of face, initial encounter: Secondary | ICD-10-CM | POA: Diagnosis present

## 2020-10-15 DIAGNOSIS — F17210 Nicotine dependence, cigarettes, uncomplicated: Secondary | ICD-10-CM | POA: Insufficient documentation

## 2020-10-15 DIAGNOSIS — W1839XA Other fall on same level, initial encounter: Secondary | ICD-10-CM | POA: Insufficient documentation

## 2020-10-15 DIAGNOSIS — S0012XA Contusion of left eyelid and periocular area, initial encounter: Secondary | ICD-10-CM | POA: Insufficient documentation
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
## 2020-10-15 NOTE — Discharge Instructions (Addendum)
Follow-up with your regular doctor if not improving in 2 to 3 days.  Return emergency department worsening. Apply ice to the face. If you are having difficulty with your vision after the swelling goes down in the left eye please follow-up with [facility]

## 2022-03-14 ENCOUNTER — Encounter: Payer: Self-pay | Admitting: Emergency Medicine

## 2022-03-14 ENCOUNTER — Other Ambulatory Visit: Payer: Self-pay

## 2022-03-14 ENCOUNTER — Ambulatory Visit
Admission: EM | Admit: 2022-03-14 | Discharge: 2022-03-14 | Disposition: A | Discharge status: Home / Self Care | Payer: BC Managed Care – PPO | Attending: Emergency Medicine | Admitting: Emergency Medicine

## 2022-03-14 DIAGNOSIS — J069 Acute upper respiratory infection, unspecified: Secondary | ICD-10-CM | POA: Diagnosis present

## 2022-03-14 DIAGNOSIS — Z20822 Contact with and (suspected) exposure to covid-19: Secondary | ICD-10-CM | POA: Diagnosis not present

## 2022-03-14 DIAGNOSIS — Z760 Encounter for issue of repeat prescription: Secondary | ICD-10-CM

## 2022-03-14 DIAGNOSIS — J45901 Unspecified asthma with (acute) exacerbation: Secondary | ICD-10-CM

## 2022-03-14 LAB — SARS CORONAVIRUS 2 BY RT PCR: SARS Coronavirus 2 by RT PCR: NEGATIVE

## 2022-03-14 MED ORDER — PREDNISONE 50 MG PO TABS: 50.0000 mg | ORAL_TABLET | Freq: Every day | ORAL | 0 refills | Status: DC

## 2022-03-14 MED ORDER — ALBUTEROL SULFATE (2.5 MG/3ML) 0.083% IN NEBU
2.5000 mg | INHALATION_SOLUTION | RESPIRATORY_TRACT | 0 refills | Status: AC | PRN
Start: 2022-03-14 — End: ?

## 2022-03-14 MED ORDER — PROMETHAZINE-DM 6.25-15 MG/5ML PO SYRP: 5.0000 mL | ORAL_SOLUTION | Freq: Four times a day (QID) | ORAL | 0 refills | Status: DC | PRN

## 2022-03-14 MED ORDER — AEROCHAMBER MV MISC
1 refills | Status: AC
Start: 2022-03-14 — End: ?

## 2022-03-14 MED ORDER — IPRATROPIUM BROMIDE 0.06 % NA SOLN: 2.0000 | Freq: Four times a day (QID) | NASAL | 0 refills | Status: AC

## 2022-03-14 MED ORDER — ALBUTEROL SULFATE HFA 108 (90 BASE) MCG/ACT IN AERS: 1.0000 | INHALATION_SPRAY | Freq: Four times a day (QID) | RESPIRATORY_TRACT | 0 refills | Status: AC | PRN

## 2022-03-14 NOTE — ED Triage Notes (Addendum)
Pt c/o cough, nasal congestion, shortness of breath, wheezing, and fatigue. Started about 3 days ago. Pt has h/o asthma.

## 2022-03-14 NOTE — ED Provider Notes (Signed)
HPI  SUBJECTIVE:  Ryan Mcintosh is a 21 y.o. male who presents with 3 days of nasal congestion, sore throat, fatigue, chest congestion, body aches, clear rhinorrhea, postnasal drip, dry cough, wheezing, shortness of breath, diffuse chest tightness.  States that chest tightness is identical to previous asthma exacerbations.  He is unable to sleep at night secondary to the cough.  No fevers, headaches, sinus pain or pressure, facial swelling, upper dental pain, dyspnea on exertion. no chest pain or pressure, nausea, diaphoresis.  No vomiting, diarrhea, abdominal pain.  No calf pain, hemoptysis, swelling, surgery in the past 4 weeks, recent immobilization, exogenous estrogen.  No known COVID, flu, strep exposure.  He did not get the COVID vaccines.  No antibiotics in the past month.  He tried an albuterol nebulizer and ibuprofen 600 mg with temporary improvement in his symptoms.  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  Past medical history of asthma, states this feels identical to previous asthma exacerbations.  He was last admitted for asthma 4 to 5 years ago.  No intubations.  No history of PE, DVT, cancer.  PCP[facility]   Past Medical History:  Diagnosis Date   Asthma    Seasonal allergies     Past Surgical History:  Procedure Laterality Date   FRACTURE SURGERY      Family History  Problem Relation Age of Onset   Healthy Mother    Healthy Father     Social History   Tobacco Use   Smoking status: Some Days    Types: Cigarettes   Smokeless tobacco: Current    Types: Snuff  Vaping Use   Vaping Use: Some days  Substance Use Topics   Alcohol use: Yes    Alcohol/week: 20.0 standard drinks of alcohol    Types: 8 Shots of liquor, 12 Cans of beer per week   Drug use: Not Currently    Types: Marijuana    No current facility-administered medications for this encounter.  Current Outpatient Medications:    albuterol (PROVENTIL) (2.5 MG/3ML) 0.083%  nebulizer solution, Take 3 mLs (2.5 mg total) by nebulization every 4 (four) hours as needed for wheezing or shortness of breath., Disp: 75 mL, Rfl: 0   albuterol (VENTOLIN HFA) 108 (90 Base) MCG/ACT inhaler, Inhale 1-2 puffs into the lungs every 6 (six) hours as needed for wheezing or shortness of breath., Disp: 1 each, Rfl: 0   ipratropium (ATROVENT) 0.06 % nasal spray, Place 2 sprays into both nostrils 4 (four) times daily., Disp: 15 mL, Rfl: 0   predniSONE (DELTASONE) 50 MG tablet, Take 1 tablet (50 mg total) by mouth daily with breakfast., Disp: 5 tablet, Rfl: 0   promethazine-dextromethorphan (PROMETHAZINE-DM) 6.25-15 MG/5ML syrup, Take 5 mLs by mouth 4 (four) times daily as needed for cough., Disp: 118 mL, Rfl: 0   Spacer/Aero-Holding Chambers (AEROCHAMBER MV) inhaler, Use as instructed, Disp: 1 each, Rfl: 1   pantoprazole (PROTONIX) 40 MG tablet, Take 1 tablet (40 mg total) by mouth daily., Disp: 30 tablet, Rfl: 2  No Known Allergies   ROS  As noted in HPI.   Physical Exam  BP 118/86 (BP Location: Left Arm)   Pulse 79   Temp 98.4 F (36.9 C) (Oral)   Resp 16   Ht 5\' 7"  (1.702 m)   Wt 72.6 kg   SpO2 99%   BMI 25.07 kg/m   Constitutional: Well developed, well nourished, no acute distress Eyes:  EOMI, conjunctiva normal bilaterally  HENT: Normocephalic, atraumatic,mucus membranes moist.  Positive nasal congestion.  No sinus tenderness.  Normal oropharynx, normal tonsils without exudates.  Uvula midline Neck: Positive shotty cervical lymphadenopathy Respiratory: Normal inspiratory effort, lungs clear bilaterally.  Positive anterior, lateral chest wall tenderness Cardiovascular: Normal rate and rhythm, no murmurs rubs or gallops GI: nondistended skin: No rash, skin intact Musculoskeletal: Calves symmetric, nontender, no edema, no palpable cord. Neurologic: Alert & oriented x 3, no focal neuro deficits Psychiatric: Speech and behavior appropriate   ED Course   Medications  - No data to display  Orders Placed This Encounter  Procedures   SARS Coronavirus 2 by RT PCR (hospital order, performed in [facility] hospital lab) *cepheid single result test* Anterior Nasal Swab    Standing Status:   Standing    Number of Occurrences:   1    Results for orders placed or performed during the hospital encounter of 03/14/22 (from the past 24 hour(s))  SARS Coronavirus 2 by RT PCR (hospital order, performed in [facility] hospital lab) *cepheid single result test* Anterior Nasal Swab     Status: None   Collection Time: 03/14/22 10:13 AM   Specimen: Anterior Nasal Swab  Result Value Ref Range   SARS Coronavirus 2 by RT PCR NEGATIVE NEGATIVE   No results found.  ED Clinical Impression  1. Upper respiratory tract infection, unspecified type   2. Moderate asthma with acute exacerbation, unspecified whether persistent   3. Medication refill   4. Encounter for laboratory testing for COVID-19 virus      ED Assessment/Plan  Presentation consistent with a URI causing asthma exacerbation.  His lungs are clear, but he did a breathing treatment immediately before coming in.  Will check COVID.  Will prescribe molnupiravir if COVID is positive due to history of asthma, unvaccinated status.  He is at high risk for progression to severe disease  In the meantime, we will treat as a URI with asthma exacerbation.  Home with Atrovent nasal spray, regularly scheduled albuterol inhaler with a spacer, prednisone 50 mg for 5 days, Promethazine DM, saline nasal irrigation, Mucinex D.  We will also refill his albuterol nebulizer medication.  COVID-negative.  Plan as above.  Discussed labs, MDM, treatment plan, and plan for follow-up with patient. Discussed sn/sx that should prompt return to the ED. patient agrees with plan.   Meds ordered this encounter  Medications   predniSONE (DELTASONE) 50 MG tablet    Sig: Take 1 tablet (50 mg total) by mouth daily with breakfast.    Dispense:  5  tablet    Refill:  0   albuterol (PROVENTIL) (2.5 MG/3ML) 0.083% nebulizer solution    Sig: Take 3 mLs (2.5 mg total) by nebulization every 4 (four) hours as needed for wheezing or shortness of breath.    Dispense:  75 mL    Refill:  0   albuterol (VENTOLIN HFA) 108 (90 Base) MCG/ACT inhaler    Sig: Inhale 1-2 puffs into the lungs every 6 (six) hours as needed for wheezing or shortness of breath.    Dispense:  1 each    Refill:  0   Spacer/Aero-Holding Chambers (AEROCHAMBER MV) inhaler    Sig: Use as instructed    Dispense:  1 each    Refill:  1   promethazine-dextromethorphan (PROMETHAZINE-DM) 6.25-15 MG/5ML syrup    Sig: Take 5 mLs by mouth 4 (four) times daily as needed for cough.    Dispense:  118 mL    Refill:  0   ipratropium (ATROVENT) 0.06 % nasal spray    Sig: Place 2 sprays into both nostrils 4 (four) times daily.    Dispense:  15 mL    Refill:  0      *This clinic note was created using [employer]. Therefore, there may be occasional mistakes despite careful proofreading.  ?    Melynda Ripple, MD 03/14/22 1100

## 2022-03-14 NOTE — Discharge Instructions (Addendum)
I will contact you if and only if your COVID comes back positive.  In the meantime, 2 puffs from your albuterol inhaler using your spacer or a breathing treatment every 4 hours for 2 days, then every 6 hours for 2 days, then as needed.  You can back off on the albuterol if you start to improve sooner.  Prednisone, even if you feel better.  Saline nasal irrigation, Mucinex D, Atrovent nasal spray for the nasal congestion.  This to help prevent a bacterial sinus infection.  Promethazine DM for cough.  I have also refilled your albuterol nebulizer.

## 2022-05-28 IMAGING — CT CT HEAD W/O CM
4 series · 16 of 47 positions shown, 18 images · non-contrast
Comparison: None.

CLINICAL DATA: Fall, intoxicated, left eye swelling

EXAM:
CT HEAD WITHOUT CONTRAST
CT MAXILLOFACIAL WITHOUT CONTRAST
CT CERVICAL SPINE WITHOUT CONTRAST
TECHNIQUE: Multidetector CT imaging of the head, cervical spine, and
maxillofacial structures were performed using the standard protocol
without intravenous contrast. Multiplanar CT image reconstructions
of the cervical spine and maxillofacial structures were also
generated.

[Series 2: head bone · axial · 0.43mm/px · z∈[-151,-119]mm · 3 of 78 slices shown]
[im 8/78  bone]
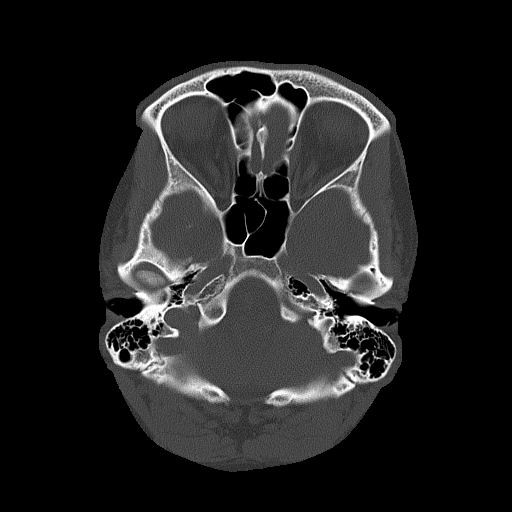
[im 16/78  bone]
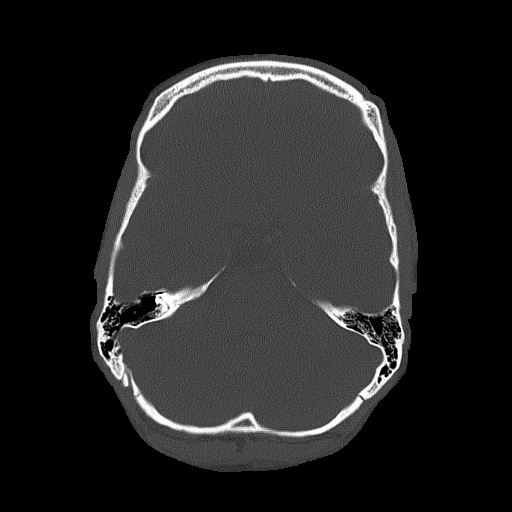
[im 24/78  bone]
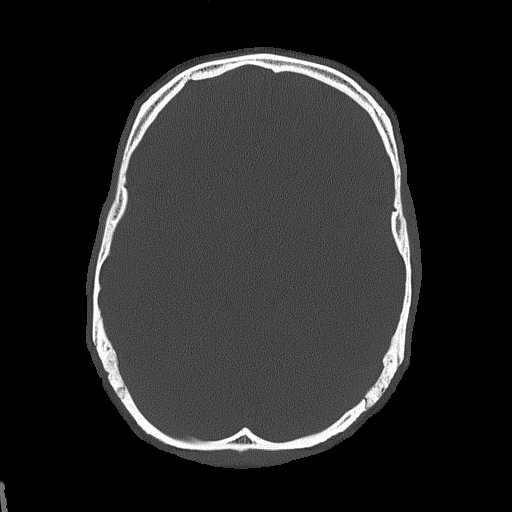

[Series 3: head wo · axial · 0.43mm/px · z∈[-150,-35]mm · 7 of 31 slices shown, 9 images]
[im 4/31  brain]
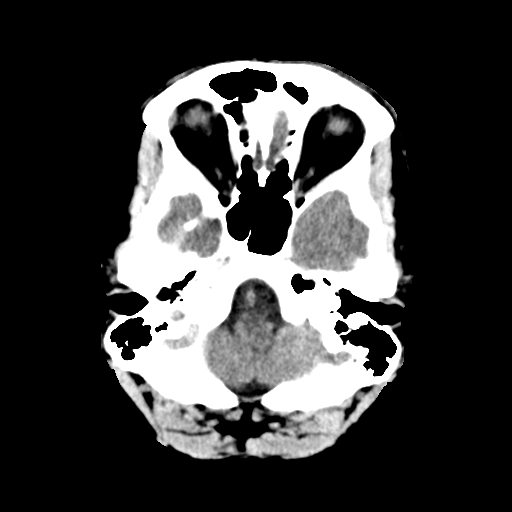
[im 4/31  bone]
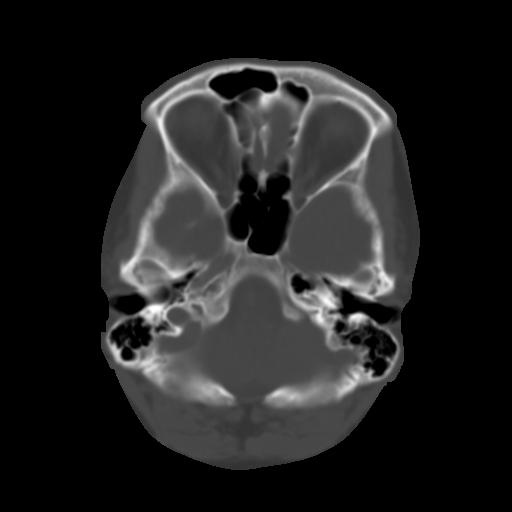
[im 8/31  brain]
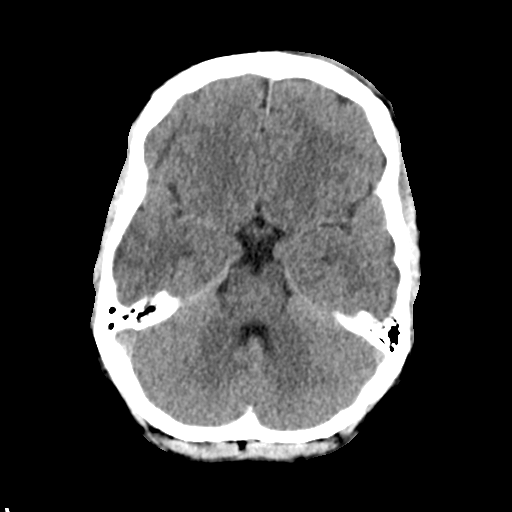
[im 12/31  brain]
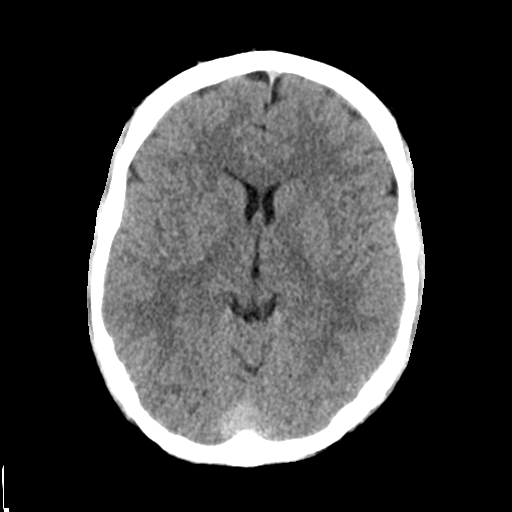
[im 16/31  brain]
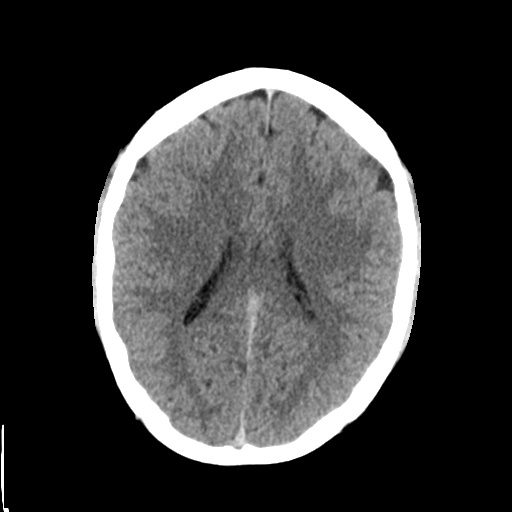
[im 19/31  brain]
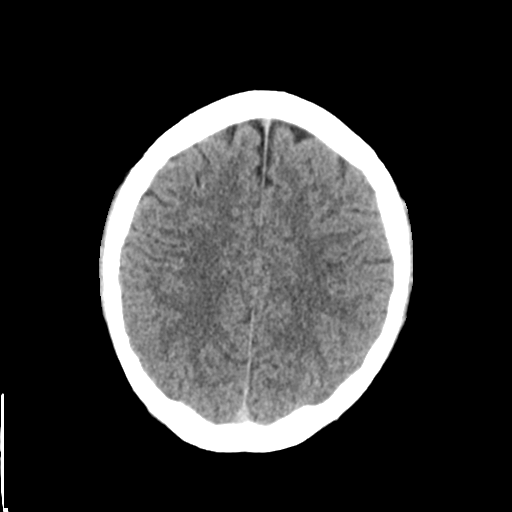
[im 19/31  bone]
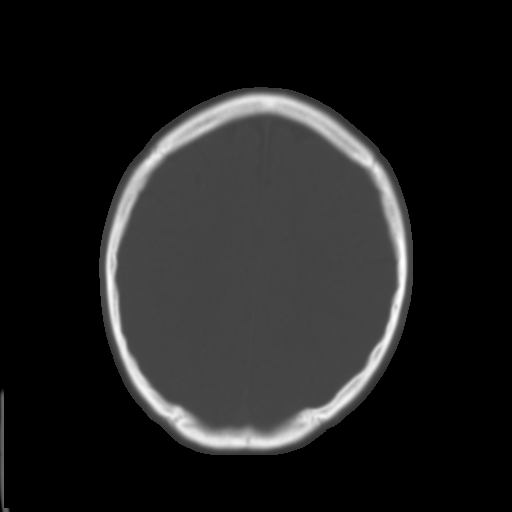
[im 23/31  brain]
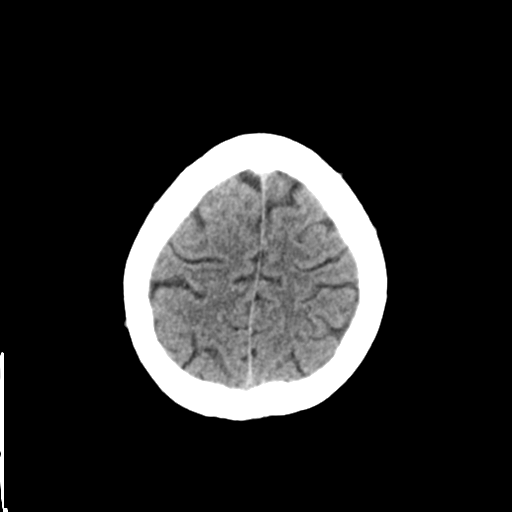
[im 27/31  brain]
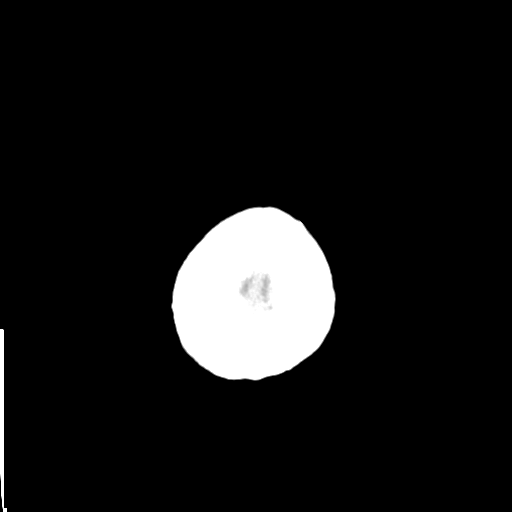

[Series 4: coronal soft tissue · coronal · 0.30mm/px · 3 of 64 slices shown]
[im 22/64  brain]
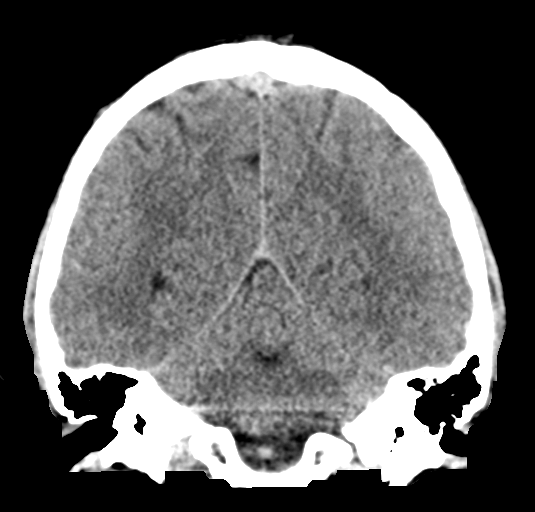
[im 29/64  brain]
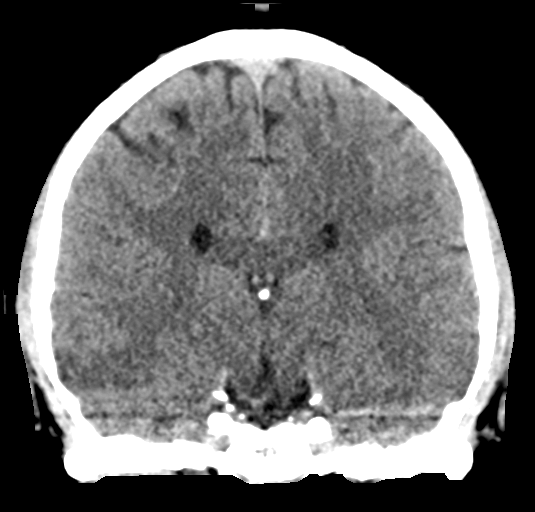
[im 36/64  brain]
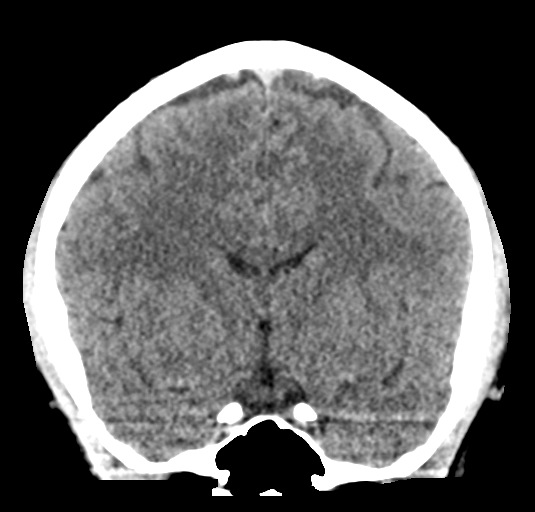

[Series 5: sagittal soft tissue · sagittal · 0.30mm/px · 3 of 55 slices shown]
[im 19/55  brain]
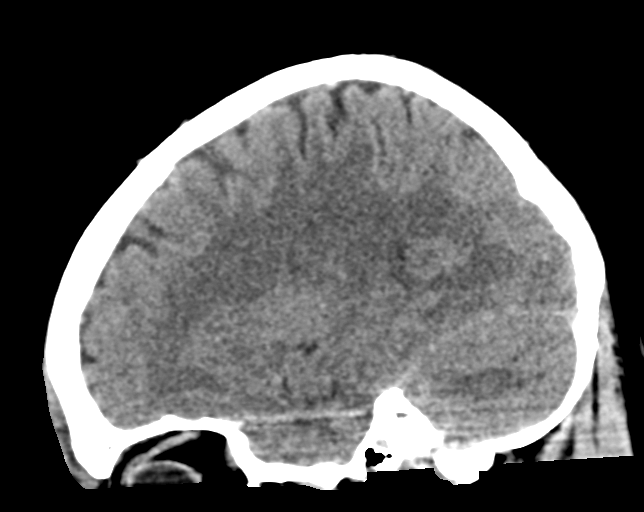
[im 28/55  brain]
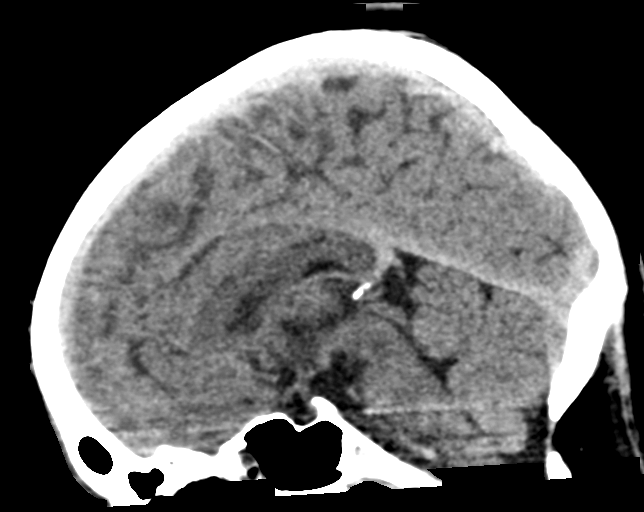
[im 37/55  brain]
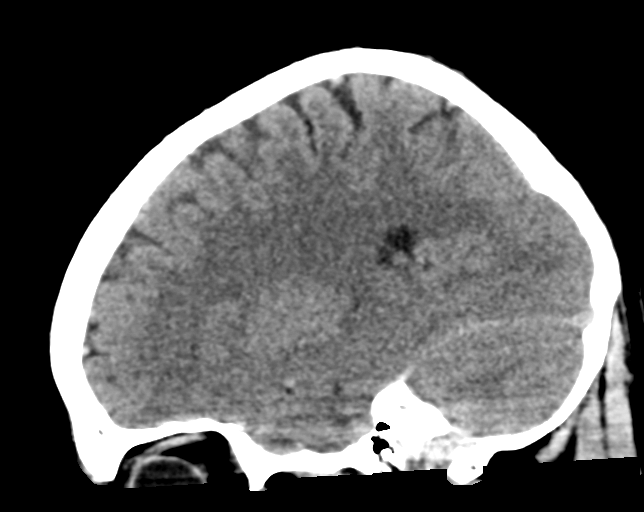

[16 of 47 positions shown; findings below may reference images not displayed]

FINDINGS: CT HEAD FINDINGS

Brain: No evidence of acute infarction, hemorrhage, hydrocephalus,
extra-axial collection or mass lesion/mass effect.

Vascular: No hyperdense vessel or unexpected calcification.

CT FACIAL BONES FINDINGS

Skull: Normal. Negative for fracture or focal lesion.

Facial bones: No displaced fractures or dislocations.

Sinuses/Orbits: No acute finding.

Other: Soft tissue contusion overlying the left orbit.

CT CERVICAL SPINE FINDINGS

Alignment: Normal.

Skull base and vertebrae: No acute fracture. No primary bone lesion
or focal pathologic process.

Soft tissues and spinal canal: No prevertebral fluid or swelling. No
visible canal hematoma.

Disc levels:  Intact.

Upper chest: Negative.

Other: None.
IMPRESSION: 1. No acute intracranial pathology.
2. No displaced fracture or dislocation of the facial bones.
3. Soft tissue contusion overlying the left orbit.
4. No fracture or static subluxation of the cervical spine.

## 2023-04-30 ENCOUNTER — Encounter: Payer: Self-pay | Admitting: Emergency Medicine

## 2023-04-30 ENCOUNTER — Ambulatory Visit
Admission: EM | Admit: 2023-04-30 | Discharge: 2023-04-30 | Disposition: A | Discharge status: Home / Self Care | Payer: Managed Care, Other (non HMO) | Attending: Physician Assistant | Admitting: Physician Assistant

## 2023-04-30 DIAGNOSIS — L237 Allergic contact dermatitis due to plants, except food: Secondary | ICD-10-CM

## 2023-04-30 MED ORDER — PREDNISONE 10 MG (21) PO TBPK
ORAL_TABLET | Freq: Every day | ORAL | 0 refills | Status: AC
Start: 2023-05-01 — End: ?

## 2023-04-30 MED ORDER — CETIRIZINE HCL 10 MG PO TABS: 10.0000 mg | ORAL_TABLET | Freq: Every day | ORAL | 0 refills | Status: AC

## 2023-04-30 MED ORDER — METHYLPREDNISOLONE ACETATE 80 MG/ML IJ SUSP
80.0000 mg | Freq: Once | INTRAMUSCULAR | Status: AC
Start: 2023-04-30 — End: 2023-04-30
  Administered 2023-04-30: 80 mg via INTRAMUSCULAR

## 2023-04-30 NOTE — ED Provider Notes (Signed)
[facility]    CSN: [id number] Arrival date & time: 04/30/23  0844      History   Chief Complaint Chief Complaint  Patient presents with   Poison Ivy    HPI Ryan Mcintosh is a 22 y.o. male.   Patient presents today with a 2 to 3-day history of pruritic rash.  Reports that he was exposed to poison ivy while he was working in the yard and moving some cut wood.  He has had similar episodes in the past that required systemic steroids for resolution of symptoms.  Denies any recent antibiotics or steroids in the past 90 days.  He reports symptoms began on his arms that are spread to his trunk and also now involve his genital region.  He denies any additional changes to personal hygiene products including soaps or detergents.  He denies history of diabetes.  He has not tried any over-the-counter medication for symptom management.  Denies any swelling of his throat, muffled voice, shortness of breath, nausea/vomiting.    Past Medical History:  Diagnosis Date   Asthma    Seasonal allergies     There are no problems to display for this patient.   Past Surgical History:  Procedure Laterality Date   FRACTURE SURGERY         Home Medications    Prior to Admission medications   Medication Sig Start Date End Date Taking? Authorizing Provider  cetirizine (ZYRTEC ALLERGY) 10 MG tablet Take 1 tablet (10 mg total) by mouth at bedtime. 04/30/23  Yes Remigio Mcmillon K, PA-C  predniSONE (STERAPRED UNI-PAK 21 TAB) 10 MG (21) TBPK tablet Take by mouth daily. Take 6 tabs by mouth daily  for 2 days, then 5 tabs for 2 days, then 4 tabs for 2 days, then 3 tabs for 2 days, 2 tabs for 2 days, then 1 tab by mouth daily for 2 days 05/01/23  Yes Illana Nolting K, PA-C  albuterol (PROVENTIL) (2.5 MG/3ML) 0.083% nebulizer solution Take 3 mLs (2.5 mg total) by nebulization every 4 (four) hours as needed for wheezing or shortness of breath. 03/14/22   Domenick Gong, MD  albuterol (VENTOLIN  HFA) 108 (90 Base) MCG/ACT inhaler Inhale 1-2 puffs into the lungs every 6 (six) hours as needed for wheezing or shortness of breath. 03/14/22   Domenick Gong, MD  ipratropium (ATROVENT) 0.06 % nasal spray Place 2 sprays into both nostrils 4 (four) times daily. 03/14/22   Domenick Gong, MD  pantoprazole (PROTONIX) 40 MG tablet Take 1 tablet (40 mg total) by mouth daily. 05/07/18   Tommie Sams, DO  Spacer/Aero-Holding Chambers (AEROCHAMBER MV) inhaler Use as instructed 03/14/22   Domenick Gong, MD    Family History Family History  Problem Relation Age of Onset   Healthy Mother    Healthy Father     Social History Social History   Tobacco Use   Smoking status: Former    Types: Cigarettes   Smokeless tobacco: Former    Types: Snuff  Vaping Use   Vaping status: Every Day  Substance Use Topics   Alcohol use: Yes    Alcohol/week: 20.0 standard drinks of alcohol    Types: 8 Shots of liquor, 12 Cans of beer per week   Drug use: Not Currently    Types: Marijuana     Allergies   Gramineae pollens   Review of Systems Review of Systems  Constitutional:  Negative for activity change, appetite change, fatigue and fever.  HENT:  Negative for sore throat, trouble swallowing and voice change.   Respiratory:  Negative for shortness of breath.   Cardiovascular:  Negative for chest pain.  Gastrointestinal:  Negative for abdominal pain, diarrhea, nausea and vomiting.  Musculoskeletal:  Negative for arthralgias and myalgias.  Skin:  Positive for rash. Negative for color change and wound.     Physical Exam Triage Vital Signs ED Triage Vitals  Encounter Vitals Group     BP 04/30/23 0932 (!) 148/99     Systolic BP Percentile --      Diastolic BP Percentile --      Pulse Rate 04/30/23 0932 63     Resp 04/30/23 0932 16     Temp 04/30/23 0932 98.2 F (36.8 C)     Temp Source 04/30/23 0932 Oral     SpO2 04/30/23 0932 99 %     Weight --      Height --      Head  Circumference --      Peak Flow --      Pain Score 04/30/23 0931 0     Pain Loc --      Pain Education --      Exclude from Growth Chart --    No data found.  Updated Vital Signs BP (!) 148/99 (BP Location: Left Arm)   Pulse 63   Temp 98.2 F (36.8 C) (Oral)   Resp 16   SpO2 99%   Visual Acuity Right Eye Distance:   Left Eye Distance:   Bilateral Distance:    Right Eye Near:   Left Eye Near:    Bilateral Near:     Physical Exam Vitals reviewed.  Constitutional:      General: He is awake.     Appearance: Normal appearance. He is well-developed. He is not ill-appearing.     Comments: Very pleasant male appears stated age in no acute distress sitting comfortably in exam room  HENT:     Head: Normocephalic and atraumatic.     Mouth/Throat:     Pharynx: Uvula midline. No oropharyngeal exudate, posterior oropharyngeal erythema or uvula swelling.     Comments: Normal-appearing posterior pharynx Cardiovascular:     Rate and Rhythm: Normal rate and regular rhythm.     Heart sounds: Normal heart sounds, S1 normal and S2 normal. No murmur heard. Pulmonary:     Effort: Pulmonary effort is normal.     Breath sounds: Normal breath sounds. No stridor. No wheezing, rhonchi or rales.     Comments: Clear to auscultation bilaterally Genitourinary:    Comments: Exam declined by patient. Skin:    Findings: Rash present. Rash is macular, papular and vesicular.     Comments: Maculopapular rash noted multiple areas including anterior left shoulder, left forearm, abdomen with scattered vesicles in linear distribution.  Neurological:     Mental Status: He is alert.  Psychiatric:        Behavior: Behavior is cooperative.      UC Treatments / Results  Labs (all labs ordered are listed, but only abnormal results are displayed) Labs Reviewed - No data to display  EKG   Radiology No results found.  Procedures Procedures (including critical care time)  Medications Ordered in  UC Medications  methylPREDNISolone acetate (DEPO-MEDROL) injection 80 mg (80 mg Intramuscular Given 04/30/23 0956)    Initial Impression / Assessment and Plan / UC Course  I have reviewed the triage vital signs and the nursing notes.  Pertinent labs & imaging results  that were available during my care of the patient were reviewed by me and considered in my medical decision making (see chart for details).     Patient is well-appearing, afebrile, nontoxic, nontachycardic.  Vital signs of physical exam are reassuring today.  Symptoms are consistent with Rhus dermatitis.  Given widespread nature including on sensitive areas he was given an injection of Depo-Medrol in clinic.  Will start prednisone tomorrow (05/01/2023).  Discussed that he is not to take NSAIDs with this medication due to risk of GI bleeding.  Cetirizine was sent to be taken at night to help with pruritus.  Recommended to keep area clean to avoid secondary infection.  Discussed that if anything worsens or changes he needs to be seen immediately.  Strict return precautions given.  Work excuse note provided.  Final Clinical Impressions(s) / UC Diagnoses   Final diagnoses:  Allergic contact dermatitis due to plants, except food     Discharge Instructions      We are treating you for poison ivy.  We gave you an injection today so please start prednisone tomorrow (05/01/2023).  Do not take NSAIDs with this medication including aspirin, ibuprofen/Advil, naproxen/Aleve.  Take cetirizine at night to help with the itching.  Keep the area clean to avoid any infection.  If anything worsens or changes and you have spread of rash, swelling of your throat, shortness of breath, muffled voice, nausea/vomiting you should be seen immediately.     ED Prescriptions     Medication Sig Dispense Auth. Provider   predniSONE (STERAPRED UNI-PAK 21 TAB) 10 MG (21) TBPK tablet Take by mouth daily. Take 6 tabs by mouth daily  for 2 days, then 5 tabs  for 2 days, then 4 tabs for 2 days, then 3 tabs for 2 days, 2 tabs for 2 days, then 1 tab by mouth daily for 2 days 42 tablet Ulyana Pitones K, PA-C   cetirizine (ZYRTEC ALLERGY) 10 MG tablet Take 1 tablet (10 mg total) by mouth at bedtime. 30 tablet Vaanya Shambaugh, Noberto Retort, PA-C      PDMP not reviewed this encounter.   Jeani Hawking, PA-C 04/30/23 1005

## 2023-04-30 NOTE — ED Triage Notes (Signed)
Pt presents with poison ivy on his arms, chest and groin area since yesterday.

## 2023-04-30 NOTE — Discharge Instructions (Signed)
We are treating you for poison ivy.  We gave you an injection today so please start prednisone tomorrow (05/01/2023).  Do not take NSAIDs with this medication including aspirin, ibuprofen/Advil, naproxen/Aleve.  Take cetirizine at night to help with the itching.  Keep the area clean to avoid any infection.  If anything worsens or changes and you have spread of rash, swelling of your throat, shortness of breath, muffled voice, nausea/vomiting you should be seen immediately.

## 2023-12-26 DIAGNOSIS — R079 Chest pain, unspecified: Secondary | ICD-10-CM | POA: Diagnosis present

## 2023-12-26 DIAGNOSIS — Z7951 Long term (current) use of inhaled steroids: Secondary | ICD-10-CM | POA: Diagnosis not present

## 2023-12-26 DIAGNOSIS — J45909 Unspecified asthma, uncomplicated: Secondary | ICD-10-CM | POA: Diagnosis not present

## 2023-12-26 DIAGNOSIS — F419 Anxiety disorder, unspecified: Secondary | ICD-10-CM | POA: Insufficient documentation

## 2023-12-26 DIAGNOSIS — R0789 Other chest pain: Secondary | ICD-10-CM | POA: Insufficient documentation

## 2023-12-26 DIAGNOSIS — R7401 Elevation of levels of liver transaminase levels: Secondary | ICD-10-CM | POA: Diagnosis not present

## 2023-12-27 ENCOUNTER — Emergency Department: Payer: Self-pay

## 2023-12-27 ENCOUNTER — Emergency Department
Admission: EM | Admit: 2023-12-27 | Discharge: 2023-12-27 | Disposition: A | Discharge status: Home / Self Care | Payer: Self-pay | Attending: Emergency Medicine | Admitting: Emergency Medicine

## 2023-12-27 DIAGNOSIS — F419 Anxiety disorder, unspecified: Secondary | ICD-10-CM

## 2023-12-27 DIAGNOSIS — R0789 Other chest pain: Secondary | ICD-10-CM

## 2023-12-27 LAB — HEPATIC FUNCTION PANEL
ALT: 70 U/L — ABNORMAL HIGH (ref 0–44)
AST: 52 U/L — ABNORMAL HIGH (ref 15–41)
Albumin: 4.8 g/dL (ref 3.5–5.0)
Alkaline Phosphatase: 110 U/L (ref 38–126)
Bilirubin, Direct: 0.2 mg/dL (ref 0.0–0.2)
Indirect Bilirubin: 1.5 mg/dL — ABNORMAL HIGH (ref 0.3–0.9)
Total Bilirubin: 1.7 mg/dL — ABNORMAL HIGH (ref 0.0–1.2)
Total Protein: 8 g/dL (ref 6.5–8.1)

## 2023-12-27 LAB — CBC
HCT: 47.9 % (ref 39.0–52.0)
Hemoglobin: 16.5 g/dL (ref 13.0–17.0)
MCH: 32.5 pg (ref 26.0–34.0)
MCHC: 34.4 g/dL (ref 30.0–36.0)
MCV: 94.3 fL (ref 80.0–100.0)
Platelets: 251 10*3/uL (ref 150–400)
RBC: 5.08 MIL/uL (ref 4.22–5.81)
RDW: 11.9 % (ref 11.5–15.5)
WBC: 7.5 10*3/uL (ref 4.0–10.5)
nRBC: 0 % (ref 0.0–0.2)

## 2023-12-27 LAB — BASIC METABOLIC PANEL WITH GFR
Anion gap: 11 (ref 5–15)
BUN: 9 mg/dL (ref 6–20)
CO2: 23 mmol/L (ref 22–32)
Calcium: 10.1 mg/dL (ref 8.9–10.3)
Chloride: 101 mmol/L (ref 98–111)
Creatinine, Ser: 0.91 mg/dL (ref 0.61–1.24)
GFR, Estimated: >60 mL/min (ref >60)
Glucose, Bld: 115 mg/dL — ABNORMAL HIGH (ref 70–99)
Potassium: 3.7 mmol/L (ref 3.5–5.1)
Sodium: 135 mmol/L (ref 135–145)

## 2023-12-27 LAB — TROPONIN I (HIGH SENSITIVITY): Troponin I (High Sensitivity): 4 ng/L (ref <18)

## 2023-12-27 LAB — LIPASE, BLOOD: Lipase: 28 U/L (ref 11–51)

## 2023-12-27 NOTE — Discharge Instructions (Addendum)
Please avoid NSAIDs such as aspirin (Goody powders), ibuprofen (Motrin, Advil), naproxen (Aleve) as these may worsen your symptoms.  Tylenol 1000 mg every 6 hours is safe to take as long as you have no history of liver problems (heavy alcohol use, cirrhosis, hepatitis).  Please avoid spicy, acidic (citrus fruits, tomato based sauces, salsa), greasy, fatty foods.  Please avoid caffeine and alcohol.  Smoking can also make GERD/acid reflux worse.  Over the counter medications such as TUMS, Maalox or Mylanta, pepcid, Prilosec or Nexium may help with your symptoms.  Do not take Prilosec or Nexium if you are already prescribed a proton pump inhibitor.   Steps to find a Primary Care Provider (PCP):  Call [phone] or [phone] to access "[place] Find a Doctor Service."  2.  You may also go on the [facility] website at [email or web address]

## 2023-12-27 NOTE — ED Triage Notes (Addendum)
 Pt arrives by EMS and reports chest pain x 2 hours. Pt reports drinking heavily yesterday and chest pain came on when he went to lay down for bed. Pt reports the pain as sharp. Pt has hx of GERD and reports his stomach feeling "bloated." Pt VS stable in triage. EMS gave 324 aspirin in route.

## 2023-12-27 NOTE — ED Provider Notes (Signed)
 [facility] Provider Note    Event Date/Time   First MD Initiated Contact with Patient 12/27/23 0144     (approximate)   History   Chest Pain   HPI  Ryan Mcintosh is a 23 y.o. male with history of asthma, allergies, anxiety, GERD who presents to the emergency department with sharp central chest pain, shortness of breath that started tonight.  No aggravating factors.  Symptoms resolved after taking Vistaril.  Patient thinks that anxiety was playing a role.  No fevers, cough.  No history of PE, DVT, exogenous estrogen use, recent fractures, surgery, trauma, hospitalization, prolonged travel or other immobilization. No lower extremity swelling or pain. No calf tenderness.  History provided by patient.    Past Medical History:  Diagnosis Date   Asthma    Seasonal allergies     Past Surgical History:  Procedure Laterality Date   FRACTURE SURGERY      MEDICATIONS:  Prior to Admission medications   Medication Sig Start Date End Date Taking? Authorizing Provider  albuterol  (PROVENTIL ) (2.5 MG/3ML) 0.083% nebulizer solution Take 3 mLs (2.5 mg total) by nebulization every 4 (four) hours as needed for wheezing or shortness of breath. 03/14/22   Ethlyn Herd, MD  albuterol  (VENTOLIN  HFA) 108 [phone] Base) MCG/ACT inhaler Inhale 1-2 puffs into the lungs every 6 (six) hours as needed for wheezing or shortness of breath. 03/14/22   Ethlyn Herd, MD  cetirizine  (ZYRTEC  ALLERGY) 10 MG tablet Take 1 tablet (10 mg total) by mouth at bedtime. 04/30/23   Raspet, Erin K, PA-C  ipratropium (ATROVENT ) 0.06 % nasal spray Place 2 sprays into both nostrils 4 (four) times daily. 03/14/22   Ethlyn Herd, MD  pantoprazole  (PROTONIX ) 40 MG tablet Take 1 tablet (40 mg total) by mouth daily. 05/07/18   Cook, Jayce G, DO  predniSONE  (STERAPRED UNI-PAK 21 TAB) 10 MG (21) TBPK tablet Take by mouth daily. Take 6 tabs by mouth daily  for 2 days, then 5 tabs for 2 days, then 4  tabs for 2 days, then 3 tabs for 2 days, 2 tabs for 2 days, then 1 tab by mouth daily for 2 days 05/01/23   Raspet, Betsey Brow, PA-C  Spacer/Aero-Holding Chambers (AEROCHAMBER MV) inhaler Use as instructed 03/14/22   Ethlyn Herd, MD    Physical Exam   Triage Vital Signs: ED Triage Vitals  Encounter Vitals Group     BP 12/27/23 0008 (!) 151/102     Systolic BP Percentile --      Diastolic BP Percentile --      Pulse Rate 12/27/23 0008 100     Resp 12/27/23 0008 20     Temp 12/27/23 0008 98.2 F (36.8 C)     Temp Source 12/27/23 0008 Oral     SpO2 12/27/23 0008 98 %     Weight 12/27/23 0010 183 lb (83 kg)     Height 12/27/23 0010 5\' 8"  (1.727 m)     Head Circumference --      Peak Flow --      Pain Score 12/27/23 0010 4     Pain Loc --      Pain Education --      Exclude from Growth Chart --     Most recent vital signs: Vitals:   12/27/23 0008 12/27/23 0208  BP: (!) 151/102 (!) 141/103  Pulse: 100   Resp: 20 16  Temp: 98.2 F (36.8 C)   SpO2: 98%  CONSTITUTIONAL: Alert, responds appropriately to questions. Well-appearing; well-nourished HEAD: Normocephalic, atraumatic EYES: Conjunctivae clear, pupils appear equal, sclera nonicteric ENT: normal nose; moist mucous membranes NECK: Supple, normal ROM CARD: RRR; S1 and S2 appreciated RESP: Normal chest excursion without splinting or tachypnea; breath sounds clear and equal bilaterally; no wheezes, no rhonchi, no rales, no hypoxia or respiratory distress, speaking full sentences ABD/GI: Non-distended; soft, non-tender, no rebound, no guarding, no peritoneal signs BACK: The back appears normal EXT: Normal ROM in all joints; no deformity noted, no edema, no calf tenderness or calf swelling SKIN: Normal color for age and race; warm; no rash on exposed skin NEURO: Moves all extremities equally, normal speech PSYCH: The patient's mood and manner are appropriate.   ED Results / Procedures / Treatments   LABS: (all labs  ordered are listed, but only abnormal results are displayed) Labs Reviewed  BASIC METABOLIC PANEL WITH GFR - Abnormal; Notable for the following components:      Result Value   Glucose, Bld 115 (*)    All other components within normal limits  HEPATIC FUNCTION PANEL - Abnormal; Notable for the following components:   AST 52 (*)    ALT 70 (*)    Total Bilirubin 1.7 (*)    Indirect Bilirubin 1.5 (*)    All other components within normal limits  CBC  LIPASE, BLOOD  TROPONIN I (HIGH SENSITIVITY)     EKG:  EKG Interpretation Date/Time:  Saturday December 27 2023 00:11:19 EDT Ventricular Rate:  75 PR Interval:  108 QRS Duration:  80 QT Interval:  390 QTC Calculation: 435 R Axis:   70  Text Interpretation: Sinus rhythm with sinus arrhythmia with short PR Nonspecific T wave abnormality Abnormal ECG When compared with ECG of 15-Nov-2018 12:24, Vent. rate has decreased BY  44 BPM T wave inversion now evident in Anterior leads Confirmed by Aribella Vavra, Starling Eck [phone]) on 12/27/2023 1:44:43 AM         RADIOLOGY: My personal review and interpretation of imaging: Chest x-Bordelon clear.  I have personally reviewed all radiology reports.   DG Chest Portable 1 View Result Date: 12/27/2023 CLINICAL DATA:  Chest pain and shortness of breath EXAM: PORTABLE CHEST 1 VIEW COMPARISON:  11/15/2018 FINDINGS: The heart size and mediastinal contours are within normal limits. Both lungs are clear. The visualized skeletal structures are unremarkable. IMPRESSION: No active disease. Electronically Signed   By: Rozell Cornet M.D.   On: 12/27/2023 02:16     PROCEDURES:  Critical Care performed: No     Procedures    IMPRESSION / MDM / ASSESSMENT AND PLAN / ED COURSE  I reviewed the triage vital signs and the nursing notes.    Patient here with atypical chest pain.  Suspect anxiety playing a role.  Patient feeling better.  The patient is on the cardiac monitor to evaluate for evidence of arrhythmia and/or  significant heart rate changes.   DIFFERENTIAL DIAGNOSIS (includes but not limited to):   Anxiety, GERD, less likely ACS, PE, dissection, pneumonia, pneumothorax, CHF   Patient's presentation is most consistent with acute presentation with potential threat to life or bodily function.   PLAN: Labs obtained from triage.  Normal hemoglobin, electrolytes.  Negative troponin.  Patient has no risk factors for PE.  Initial EKG showed nonspecific T wave flattening.  Repeat is normal.  No ischemia, arrhythmia.  Chest x-Leitzel reviewed and interpreted by myself and radiologist and is clear.  Mildly elevated blood pressure here which I suspect is likely  still secondary to anxiety.  Have encouraged him to follow-up with his outpatient providers.  I feel he is safe for discharge home without further emergent workup.  Patient also comfortable with this plan.  He will continue Vistaril as needed for anxiety.  Of note, patient does have mildly elevated AST, ALT.  Reports he was drinking yesterday.  Advised him to avoid Tylenol, ethanol.  His abdominal exam is benign.  Recommended that this be followed by his primary care doctor as an outpatient as well.   MEDICATIONS GIVEN IN ED: Medications - No data to display   ED COURSE:  At this time, I do not feel there is any life-threatening condition present. I reviewed all nursing notes, vitals, pertinent previous records.  All lab and urine results, EKGs, imaging ordered have been independently reviewed and interpreted by myself.  I reviewed all available radiology reports from any imaging ordered this visit.  Based on my assessment, I feel the patient is safe to be discharged home without further emergent workup and can continue workup as an outpatient as needed. Discussed all findings, treatment plan as well as usual and customary return precautions.  They verbalize understanding and are comfortable with this plan.  Outpatient follow-up has been provided as needed.  All  questions have been answered.    CONSULTS:  none   OUTSIDE RECORDS REVIEWED: Reviewed last family medicine notes in 2020.       FINAL CLINICAL IMPRESSION(S) / ED DIAGNOSES   Final diagnoses:  Atypical chest pain  Anxiety     Rx / DC Orders   ED Discharge Orders     None        Note:  This document was prepared using Dragon voice recognition software and may include unintentional dictation errors.   Liddy Deam, Clover Dao, DO 12/27/23 [phone]

## 2024-05-10 ENCOUNTER — Ambulatory Visit
Admission: EM | Admit: 2024-05-10 | Discharge: 2024-05-10 | Disposition: A | Discharge status: Home / Self Care | Attending: Physician Assistant | Admitting: Physician Assistant

## 2024-05-10 DIAGNOSIS — H18892 Other specified disorders of cornea, left eye: Secondary | ICD-10-CM

## 2024-05-10 DIAGNOSIS — S0502XA Injury of conjunctiva and corneal abrasion without foreign body, left eye, initial encounter: Secondary | ICD-10-CM | POA: Diagnosis not present

## 2024-05-10 DIAGNOSIS — K219 Gastro-esophageal reflux disease without esophagitis: Secondary | ICD-10-CM | POA: Insufficient documentation

## 2024-05-10 MED ORDER — KETOROLAC TROMETHAMINE 0.5 % OP SOLN: 1.0000 [drp] | Freq: Four times a day (QID) | OPHTHALMIC | 0 refills | Status: AC | PRN

## 2024-05-10 MED ORDER — MOXIFLOXACIN HCL 0.5 % OP SOLN: 1.0000 [drp] | Freq: Three times a day (TID) | OPHTHALMIC | 0 refills | Status: AC

## 2024-05-10 NOTE — ED Provider Notes (Signed)
 [facility]    CSN: [id number] Arrival date & time: 05/10/24  1038      History   Chief Complaint Chief Complaint  Patient presents with   Foreign Body in Eye    HPI Ryan Mcintosh is a 23 y.o. male presenting for left eye redness, pain, photophobia, and tearing that began today when he was trying to put his contact lens in.  Patient reports foreign body sensation of the eye.  No discolored drainage, swelling.  Has not treated condition yet in any way.  HPI  Past Medical History:  Diagnosis Date   Asthma    Seasonal allergies     Patient Active Problem List   Diagnosis Date Noted   GERD (gastroesophageal reflux disease) 05/10/2024   Allergic rhinitis due to pollen 01/06/2019   Anxiety about health 01/06/2019   Moderate persistent asthma without complication 10/27/2018    Past Surgical History:  Procedure Laterality Date   FRACTURE SURGERY         Home Medications    Prior to Admission medications   Medication Sig Start Date End Date Taking? Authorizing Provider  ketorolac (ACULAR) 0.5 % ophthalmic solution Place 1 drop into the left eye 4 (four) times daily as needed (eye pain, redness). 05/10/24  Yes Arvis Huxley B, PA-C  moxifloxacin (VIGAMOX) 0.5 % ophthalmic solution Place 1 drop into the left eye 3 (three) times daily for 7 days. 05/10/24 05/17/24 Yes Arvis Huxley NOVAK, PA-C  albuterol  (PROVENTIL ) (2.5 MG/3ML) 0.083% nebulizer solution Take 3 mLs (2.5 mg total) by nebulization every 4 (four) hours as needed for wheezing or shortness of breath. 03/14/22   Van Knee, MD  albuterol  (VENTOLIN  HFA) 108 (90 Base) MCG/ACT inhaler Inhale 1-2 puffs into the lungs every 6 (six) hours as needed for wheezing or shortness of breath. 03/14/22   Van Knee, MD  cetirizine  (ZYRTEC  ALLERGY) 10 MG tablet Take 1 tablet (10 mg total) by mouth at bedtime. 04/30/23   Raspet, Erin K, PA-C  ipratropium (ATROVENT ) 0.06 % nasal spray Place 2 sprays into both  nostrils 4 (four) times daily. 03/14/22   Van Knee, MD  pantoprazole  (PROTONIX ) 40 MG tablet Take 1 tablet (40 mg total) by mouth daily. 05/07/18   Cook, Jayce G, DO  predniSONE  (STERAPRED UNI-PAK 21 TAB) 10 MG (21) TBPK tablet Take by mouth daily. Take 6 tabs by mouth daily  for 2 days, then 5 tabs for 2 days, then 4 tabs for 2 days, then 3 tabs for 2 days, 2 tabs for 2 days, then 1 tab by mouth daily for 2 days 05/01/23   Raspet, Rocky POUR, PA-C  Spacer/Aero-Holding Chambers (AEROCHAMBER MV) inhaler Use as instructed 03/14/22   Van Knee, MD    Family History Family History  Problem Relation Age of Onset   Healthy Mother    Healthy Father     Social History Social History   Tobacco Use   Smoking status: Former    Types: Cigarettes   Smokeless tobacco: Former    Types: Snuff  Vaping Use   Vaping status: Every Day  Substance Use Topics   Alcohol use: Yes    Alcohol/week: 20.0 standard drinks of alcohol    Types: 8 Shots of liquor, 12 Cans of beer per week   Drug use: Not Currently    Types: Marijuana     Allergies   Gramineae pollens   Review of Systems Review of Systems  HENT:  Negative for facial swelling.  Eyes:  Positive for photophobia, pain, redness and visual disturbance. Negative for discharge and itching.  Neurological:  Negative for dizziness and headaches.     Physical Exam Triage Vital Signs ED Triage Vitals  Encounter Vitals Group     BP 05/10/24 1219 (!) 175/99     Girls Systolic BP Percentile --      Girls Diastolic BP Percentile --      Boys Systolic BP Percentile --      Boys Diastolic BP Percentile --      Pulse Rate 05/10/24 1219 65     Resp 05/10/24 1219 16     Temp 05/10/24 1219 98.1 F (36.7 C)     Temp Source 05/10/24 1219 Oral     SpO2 05/10/24 1219 98 %     Weight 05/10/24 1218 180 lb (81.6 kg)     Height 05/10/24 1218 5' 7 (1.702 m)     Head Circumference --      Peak Flow --      Pain Score 05/10/24 1226 10      Pain Loc --      Pain Education --      Exclude from Growth Chart --    No data found.  Updated Vital Signs BP (!) 175/99 (BP Location: Right Arm)   Pulse 65   Temp 98.1 F (36.7 C) (Oral)   Resp 16   Ht 5' 7 (1.702 m)   Wt 180 lb (81.6 kg)   SpO2 98%   BMI 28.19 kg/m   Visual Acuity Right Eye Distance: 20/25 Left Eye Distance: 20/70 Bilateral Distance: 20/25 (w/correction)  Physical Exam Vitals and nursing note reviewed.  Constitutional:      General: He is not in acute distress.    Appearance: Normal appearance. He is well-developed. He is ill-appearing.  HENT:     Head: Normocephalic and atraumatic.  Eyes:     General: Lids are normal. Vision grossly intact. No scleral icterus.       Left eye: Discharge (clear watery) present.No foreign body or hordeolum.     Extraocular Movements: Extraocular movements intact.     Conjunctiva/sclera:     Left eye: Left conjunctiva is injected.     Pupils: Pupils are equal, round, and reactive to light.     Left eye: Corneal abrasion present.   Cardiovascular:     Rate and Rhythm: Normal rate and regular rhythm.  Pulmonary:     Effort: Pulmonary effort is normal. No respiratory distress.     Breath sounds: Normal breath sounds.  Musculoskeletal:     Cervical back: Neck supple.  Skin:    General: Skin is warm and dry.     Capillary Refill: Capillary refill takes less than 2 seconds.  Neurological:     General: No focal deficit present.     Mental Status: He is alert. Mental status is at baseline.     Motor: No weakness.     Gait: Gait normal.  Psychiatric:        Mood and Affect: Mood normal.        Behavior: Behavior normal.      UC Treatments / Results  Labs (all labs ordered are listed, but only abnormal results are displayed) Labs Reviewed - No data to display  EKG   Radiology No results found.  Procedures Procedures (including critical care time)  Medications Ordered in UC Medications - No data to  display  Initial Impression / Assessment and Plan /  UC Course  I have reviewed the triage vital signs and the nursing notes.  Pertinent labs & imaging results that were available during my care of the patient were reviewed by me and considered in my medical decision making (see chart for details).   23 year old male presents for left eye redness, pain and photophobia that began when he was putting his contact lens in this morning.  Vision grossly intact.  20/70 in the left eye.  No foreign bodies.  After instilling a drop of tetracaine fluorescein stain performed.  Superficial corneal abrasion of the lateral aspect of the eye.  He has diffuse conjunctival injection and copious clear watery drainage.  Corneal irritation and abrasion.  Sent Vigamox for prophylaxis and ketorolac.  Also advised over-the-counter redness relief eyedrops, cool compresses and avoiding putting contact lenses and until this clears up.  Reviewed following up with eye specialist if not improving or symptoms worsen.   Final Clinical Impressions(s) / UC Diagnoses   Final diagnoses:  Corneal irritation of left eye  Abrasion of left cornea, initial encounter     Discharge Instructions      -You eye is irritated and there is a little scratch - Leave the contacts out until the redness clears. - Use ice or cool compresses and wear sunglasses if needed due to light sensitivity. - I sent 2 different eyedrops to the pharmacy.  One of them is called Vigamox to try to prevent infection but if he noticed discolored drainage please return for reevaluation.  The other eyedrop is called ketorolac which is an anti-inflammatory/pain relieving eyedrop. - For any acute worsening of symptoms or not improving try to follow-up with [facility] or go to ER.     ED Prescriptions     Medication Sig Dispense Auth. Provider   moxifloxacin (VIGAMOX) 0.5 % ophthalmic solution Place 1 drop into the left eye 3 (three) times daily  for 7 days. 3 mL Arvis Huxley B, PA-C   ketorolac (ACULAR) 0.5 % ophthalmic solution Place 1 drop into the left eye 4 (four) times daily as needed (eye pain, redness). 5 mL Arvis Huxley NOVAK, PA-C      PDMP not reviewed this encounter.   Arvis Huxley NOVAK, PA-C 05/10/24 1353

## 2024-05-10 NOTE — ED Triage Notes (Signed)
 Pt c/o foreign body in L eye. States put his contact in this morning & felt something in eye. Eye red,painful & watery. Has tried OTC meds w/o relief.

## 2024-05-10 NOTE — Discharge Instructions (Addendum)
-  You eye is irritated and there is a little scratch - Leave the contacts out until the redness clears. - Use ice or cool compresses and wear sunglasses if needed due to light sensitivity. - I sent 2 different eyedrops to the pharmacy.  One of them is called Vigamox to try to prevent infection but if he noticed discolored drainage please return for reevaluation.  The other eyedrop is called ketorolac which is an anti-inflammatory/pain relieving eyedrop. - For any acute worsening of symptoms or not improving try to follow-up with [facility] or go to ER.
